# Patient Record
Sex: Female | Born: 1955 | Race: White | Hispanic: No | Marital: Married | Smoking: Never smoker
Health system: Southern US, Community
[De-identification: ages and names within clinical notes are randomized; demographics above are authoritative.]

## PROBLEM LIST (undated history)

## (undated) DIAGNOSIS — Z800 Family history of malignant neoplasm of digestive organs: Secondary | ICD-10-CM

## (undated) DIAGNOSIS — M8580 Other specified disorders of bone density and structure, unspecified site: Secondary | ICD-10-CM

## (undated) DIAGNOSIS — N6019 Diffuse cystic mastopathy of unspecified breast: Secondary | ICD-10-CM

## (undated) DIAGNOSIS — C541 Malignant neoplasm of endometrium: Secondary | ICD-10-CM

## (undated) DIAGNOSIS — Z801 Family history of malignant neoplasm of trachea, bronchus and lung: Secondary | ICD-10-CM

## (undated) HISTORY — DX: Family history of malignant neoplasm of digestive organs: Z80.0

## (undated) HISTORY — DX: Family history of malignant neoplasm of trachea, bronchus and lung: Z80.1

## (undated) HISTORY — DX: Other specified disorders of bone density and structure, unspecified site: M85.80

## (undated) HISTORY — PX: TUBAL LIGATION: SHX77

## (undated) HISTORY — DX: Malignant neoplasm of endometrium: C54.1

## (undated) HISTORY — DX: Diffuse cystic mastopathy of unspecified breast: N60.19

---

## 1990-07-20 HISTORY — PX: TOTAL ABDOMINAL HYSTERECTOMY: SHX209

## 1998-10-21 ENCOUNTER — Other Ambulatory Visit: Admission: RE | Admit: 1998-10-21 | Discharge: 1998-10-21 | Payer: Self-pay | Admitting: Obstetrics and Gynecology

## 1999-04-28 ENCOUNTER — Encounter: Admission: RE | Admit: 1999-04-28 | Discharge: 1999-04-28 | Payer: Self-pay | Admitting: Obstetrics and Gynecology

## 1999-04-28 ENCOUNTER — Other Ambulatory Visit: Admission: RE | Admit: 1999-04-28 | Discharge: 1999-04-28 | Payer: Self-pay | Admitting: Obstetrics and Gynecology

## 1999-10-27 ENCOUNTER — Other Ambulatory Visit: Admission: RE | Admit: 1999-10-27 | Discharge: 1999-10-27 | Payer: Self-pay | Admitting: Obstetrics and Gynecology

## 2000-04-07 ENCOUNTER — Encounter: Admission: RE | Admit: 2000-04-07 | Discharge: 2000-04-07 | Payer: Self-pay | Admitting: Family Medicine

## 2000-04-07 ENCOUNTER — Encounter: Payer: Self-pay | Admitting: Family Medicine

## 2000-05-03 ENCOUNTER — Other Ambulatory Visit: Admission: RE | Admit: 2000-05-03 | Discharge: 2000-05-03 | Payer: Self-pay | Admitting: Obstetrics and Gynecology

## 2000-05-03 ENCOUNTER — Encounter: Payer: Self-pay | Admitting: Obstetrics and Gynecology

## 2000-05-03 ENCOUNTER — Encounter: Admission: RE | Admit: 2000-05-03 | Discharge: 2000-05-03 | Payer: Self-pay | Admitting: Obstetrics and Gynecology

## 2000-06-23 ENCOUNTER — Ambulatory Visit (HOSPITAL_COMMUNITY): Admission: RE | Admit: 2000-06-23 | Discharge: 2000-06-23 | Payer: Self-pay | Admitting: Gastroenterology

## 2001-05-04 ENCOUNTER — Encounter: Admission: RE | Admit: 2001-05-04 | Discharge: 2001-05-04 | Payer: Self-pay | Admitting: Family Medicine

## 2001-05-04 ENCOUNTER — Encounter: Payer: Self-pay | Admitting: Family Medicine

## 2001-08-02 ENCOUNTER — Other Ambulatory Visit: Admission: RE | Admit: 2001-08-02 | Discharge: 2001-08-02 | Payer: Self-pay | Admitting: Obstetrics and Gynecology

## 2001-08-24 ENCOUNTER — Encounter: Admission: RE | Admit: 2001-08-24 | Discharge: 2001-08-24 | Payer: Self-pay | Admitting: Obstetrics and Gynecology

## 2001-08-24 ENCOUNTER — Encounter: Payer: Self-pay | Admitting: Obstetrics and Gynecology

## 2002-05-10 ENCOUNTER — Encounter: Admission: RE | Admit: 2002-05-10 | Discharge: 2002-05-10 | Payer: Self-pay | Admitting: Obstetrics and Gynecology

## 2002-05-10 ENCOUNTER — Encounter: Payer: Self-pay | Admitting: Obstetrics and Gynecology

## 2002-08-04 ENCOUNTER — Other Ambulatory Visit: Admission: RE | Admit: 2002-08-04 | Discharge: 2002-08-04 | Payer: Self-pay | Admitting: Obstetrics and Gynecology

## 2003-05-25 ENCOUNTER — Encounter: Admission: RE | Admit: 2003-05-25 | Discharge: 2003-05-25 | Payer: Self-pay | Admitting: Obstetrics and Gynecology

## 2003-08-09 ENCOUNTER — Other Ambulatory Visit: Admission: RE | Admit: 2003-08-09 | Discharge: 2003-08-09 | Payer: Self-pay | Admitting: Obstetrics and Gynecology

## 2003-10-17 ENCOUNTER — Ambulatory Visit (HOSPITAL_COMMUNITY): Admission: RE | Admit: 2003-10-17 | Discharge: 2003-10-17 | Payer: Self-pay | Admitting: *Deleted

## 2003-10-17 ENCOUNTER — Ambulatory Visit (HOSPITAL_BASED_OUTPATIENT_CLINIC_OR_DEPARTMENT_OTHER): Admission: RE | Admit: 2003-10-17 | Discharge: 2003-10-17 | Payer: Self-pay | Admitting: *Deleted

## 2003-10-17 ENCOUNTER — Encounter (INDEPENDENT_AMBULATORY_CARE_PROVIDER_SITE_OTHER): Payer: Self-pay | Admitting: Specialist

## 2004-05-28 ENCOUNTER — Encounter: Admission: RE | Admit: 2004-05-28 | Discharge: 2004-05-28 | Payer: Self-pay | Admitting: *Deleted

## 2004-08-13 ENCOUNTER — Other Ambulatory Visit: Admission: RE | Admit: 2004-08-13 | Discharge: 2004-08-13 | Payer: Self-pay | Admitting: *Deleted

## 2005-06-03 ENCOUNTER — Encounter: Admission: RE | Admit: 2005-06-03 | Discharge: 2005-06-03 | Payer: Self-pay | Admitting: Family Medicine

## 2005-10-14 ENCOUNTER — Other Ambulatory Visit: Admission: RE | Admit: 2005-10-14 | Discharge: 2005-10-14 | Payer: Self-pay | Admitting: Obstetrics & Gynecology

## 2005-11-04 ENCOUNTER — Encounter: Admission: RE | Admit: 2005-11-04 | Discharge: 2005-11-04 | Payer: Self-pay | Admitting: Obstetrics & Gynecology

## 2006-06-09 ENCOUNTER — Encounter: Admission: RE | Admit: 2006-06-09 | Discharge: 2006-06-09 | Payer: Self-pay | Admitting: Obstetrics & Gynecology

## 2006-10-20 ENCOUNTER — Other Ambulatory Visit: Admission: RE | Admit: 2006-10-20 | Discharge: 2006-10-20 | Payer: Self-pay | Admitting: Obstetrics & Gynecology

## 2007-07-27 ENCOUNTER — Encounter: Admission: RE | Admit: 2007-07-27 | Discharge: 2007-07-27 | Payer: Self-pay | Admitting: Obstetrics & Gynecology

## 2007-10-26 ENCOUNTER — Other Ambulatory Visit: Admission: RE | Admit: 2007-10-26 | Discharge: 2007-10-26 | Payer: Self-pay | Admitting: Obstetrics & Gynecology

## 2008-09-04 ENCOUNTER — Encounter: Admission: RE | Admit: 2008-09-04 | Discharge: 2008-09-04 | Payer: Self-pay | Admitting: Obstetrics & Gynecology

## 2008-11-01 ENCOUNTER — Other Ambulatory Visit: Admission: RE | Admit: 2008-11-01 | Discharge: 2008-11-01 | Payer: Self-pay | Admitting: Obstetrics & Gynecology

## 2009-11-13 ENCOUNTER — Encounter: Admission: RE | Admit: 2009-11-13 | Discharge: 2009-11-13 | Payer: Self-pay | Admitting: Obstetrics & Gynecology

## 2010-08-09 ENCOUNTER — Other Ambulatory Visit: Payer: Self-pay | Admitting: Obstetrics & Gynecology

## 2010-08-09 DIAGNOSIS — Z1239 Encounter for other screening for malignant neoplasm of breast: Secondary | ICD-10-CM

## 2010-12-05 NOTE — Op Note (Signed)
NAMESOFHIA, ULIBARRI                       ACCOUNT NO.:  [id number]   MEDICAL RECORD NO.:  [id number]                   PATIENT TYPE:  AMB   LOCATION:  DSC                                  FACILITY:  [facility]   PHYSICIAN:  Lowell Bouton, M.D.      DATE OF BIRTH:  03-24-1956   DATE OF PROCEDURE:  10/17/2003  DATE OF DISCHARGE:                                 OPERATIVE REPORT   PREOPERATIVE DIAGNOSES:  Dorsal ganglion, left wrist.   POSTOPERATIVE DIAGNOSES:  Dorsal ganglion, left wrist.   PROCEDURE:  Excision of dorsal ganglion, left wrist.   SURGEON:  Lowell Bouton, M.D.   ANESTHESIA:  General.   FINDINGS:  The patient had a very large ganglion cyst that appeared to arise  from the scapholunate joint with a very narrow stalk.   DESCRIPTION OF PROCEDURE:  Under general anesthesia with a tourniquet on the  left arm, the left hand was prepped and draped in the usual fashion and  after exsanguinating the limb, the tourniquet was inflated to 250 mmHg.  A  transverse incision was made over the dorsum of the wrist and sharp  dissection was carried through the subcutaneous tissues. Bleeding points  were coagulated.  Blunt dissection was carried down to the ganglion and it  was dissected out bluntly.  The ganglion appeared to arise from the  scapholunate interval with a small stalk. This was completely dissected out  and the ganglion was removed in toto.  The stalk was then debrided down to  the joint and the base of it was coagulated. The wound was irrigated with  saline, vessiloop drain was left in for drainage, 0.5% Marcaine was placed  in the skin for pain control.  The subcutaneous tissue was closed with 4-0  Vicryl and the skin with a 3-0 subcuticular Prolene. Steri-Strips were  applied followed by sterile dressings and a volar wrist splint. The  tourniquet was released with good circulation of the hand. The patient went  to the recovery room awake in stable  and good condition.                                               Lowell Bouton, M.D.    EMM/MEDQ  D:  10/17/2003  T:  10/17/2003  Job:  [id number]

## 2010-12-05 NOTE — Procedures (Signed)
[facility]  Patient:    Gina Maldonado, Gina Maldonado                    MRN: [id number] Proc. Date: 06/23/00 Adm. Date:  [id number] Attending:  Louie Bun CC:         Gloriajean Dell. Andrey Campanile, M.D.   Procedure Report  PROCEDURE:  Colonoscopy.  INDICATION FOR PROCEDURE:  Family history of colon cancer in a first degree relative.  DESCRIPTION OF PROCEDURE:  The patient was placed in the left lateral decubitus position and placed on the pulse monitor with continuous low-flow oxygen delivered by nasal cannula.  She was sedated with 70 mg IV Demerol and 9 mg IV Versed.  The Olympus video colonoscope was inserted into the rectum and advanced to the cecum, confirmed by transillumination of McBurneys point and visualization of the ileocecal valve and appendiceal orifice.  The prep was good.  The cecum, ascending, transverse, descending, and sigmoid colon all appeared normal with no masses, polyps, diverticula, or other mucosal abnormalities.  The rectum likewise appeared normal.  On retroflex view, the anus did reveal some small internal hemorrhoids.  The colonoscope was then withdrawn and the patient returned to the recovery room in stable condition. She tolerated the procedure well, and there were no immediate complications.  IMPRESSION:  Internal hemorrhoids, otherwise normal colonoscopy.  PLAN:  Repeat colonoscopy in five years based on the family history. DD:  06/23/00 TD:  06/23/00 Job: [id number]

## 2011-01-13 ENCOUNTER — Ambulatory Visit: Payer: Self-pay

## 2011-01-13 ENCOUNTER — Ambulatory Visit
Admission: RE | Admit: 2011-01-13 | Discharge: 2011-01-13 | Disposition: A | Discharge status: Home / Self Care | Payer: Commercial Managed Care - PPO | Source: Ambulatory Visit | Attending: Obstetrics & Gynecology | Admitting: Obstetrics & Gynecology

## 2011-01-13 DIAGNOSIS — Z1239 Encounter for other screening for malignant neoplasm of breast: Secondary | ICD-10-CM

## 2011-08-05 ENCOUNTER — Other Ambulatory Visit: Payer: Self-pay | Admitting: Obstetrics & Gynecology

## 2011-08-05 DIAGNOSIS — Z1231 Encounter for screening mammogram for malignant neoplasm of breast: Secondary | ICD-10-CM

## 2012-03-08 ENCOUNTER — Ambulatory Visit
Admission: RE | Admit: 2012-03-08 | Discharge: 2012-03-08 | Disposition: A | Discharge status: Home / Self Care | Payer: Commercial Managed Care - PPO | Source: Ambulatory Visit | Attending: Obstetrics & Gynecology | Admitting: Obstetrics & Gynecology

## 2012-03-08 DIAGNOSIS — Z1231 Encounter for screening mammogram for malignant neoplasm of breast: Secondary | ICD-10-CM

## 2013-02-09 ENCOUNTER — Other Ambulatory Visit: Payer: Self-pay

## 2013-02-09 DIAGNOSIS — Z1231 Encounter for screening mammogram for malignant neoplasm of breast: Secondary | ICD-10-CM

## 2013-03-27 ENCOUNTER — Ambulatory Visit
Admission: RE | Admit: 2013-03-27 | Discharge: 2013-03-27 | Disposition: A | Discharge status: Home / Self Care | Payer: Commercial Managed Care - PPO | Source: Ambulatory Visit

## 2013-03-27 DIAGNOSIS — Z1231 Encounter for screening mammogram for malignant neoplasm of breast: Secondary | ICD-10-CM

## 2013-04-03 ENCOUNTER — Encounter: Payer: Self-pay | Admitting: Obstetrics & Gynecology

## 2013-05-24 ENCOUNTER — Ambulatory Visit: Payer: Self-pay | Admitting: Obstetrics & Gynecology

## 2013-05-25 ENCOUNTER — Encounter: Payer: Self-pay | Admitting: Obstetrics & Gynecology

## 2013-05-26 ENCOUNTER — Ambulatory Visit (INDEPENDENT_AMBULATORY_CARE_PROVIDER_SITE_OTHER): Payer: Commercial Managed Care - PPO | Admitting: Obstetrics & Gynecology

## 2013-05-26 ENCOUNTER — Ambulatory Visit: Payer: Self-pay | Admitting: Obstetrics & Gynecology

## 2013-05-26 ENCOUNTER — Encounter: Payer: Self-pay | Admitting: Obstetrics & Gynecology

## 2013-05-26 VITALS — BP 124/82 | HR 60 | Resp 16 | Ht 65.25 in | Wt 179.2 lb

## 2013-05-26 DIAGNOSIS — Z01419 Encounter for gynecological examination (general) (routine) without abnormal findings: Secondary | ICD-10-CM

## 2013-05-26 DIAGNOSIS — Z0000 Encounter for general adult medical examination without abnormal findings: Secondary | ICD-10-CM

## 2013-05-26 DIAGNOSIS — Z124 Encounter for screening for malignant neoplasm of cervix: Secondary | ICD-10-CM

## 2013-05-26 LAB — COMPREHENSIVE METABOLIC PANEL
ALT: 16 U/L (ref 0–35)
AST: 15 U/L (ref 0–37)
Albumin: 4.2 g/dL (ref 3.5–5.2)
Alkaline Phosphatase: 52 U/L (ref 39–117)
BUN: 18 mg/dL (ref 6–23)
CO2: 29 mEq/L (ref 19–32)
Calcium: 9.5 mg/dL (ref 8.4–10.5)
Chloride: 102 mEq/L (ref 96–112)
Creat: 0.98 mg/dL (ref 0.50–1.10)
Glucose, Bld: 80 mg/dL (ref 70–99)
Potassium: 4.1 mEq/L (ref 3.5–5.3)
Sodium: 140 mEq/L (ref 135–145)
Total Bilirubin: 0.5 mg/dL (ref 0.3–1.2)
Total Protein: 6.7 g/dL (ref 6.0–8.3)

## 2013-05-26 LAB — LIPID PANEL
Cholesterol: 178 mg/dL (ref 0–200)
HDL: 32 mg/dL — ABNORMAL LOW (ref >39)
LDL Cholesterol: 122 mg/dL — ABNORMAL HIGH (ref 0–99)
Total CHOL/HDL Ratio: 5.6 Ratio
Triglycerides: 118 mg/dL (ref <150)
VLDL: 24 mg/dL (ref 0–40)

## 2013-05-26 LAB — POCT URINALYSIS DIPSTICK
Bilirubin, UA: NEGATIVE
Blood, UA: NEGATIVE
Glucose, UA: NEGATIVE
Ketones, UA: NEGATIVE
Leukocytes, UA: NEGATIVE
Nitrite, UA: NEGATIVE
Protein, UA: NEGATIVE
Urobilinogen, UA: NEGATIVE
pH, UA: 5

## 2013-05-26 LAB — TSH: TSH: 1.754 u[IU]/mL (ref 0.350–4.500)

## 2013-05-26 LAB — HEMOGLOBIN, FINGERSTICK: Hemoglobin, fingerstick: 13.7 g/dL (ref 12.0–16.0)

## 2013-05-26 LAB — VITAMIN D 25 HYDROXY (VIT D DEFICIENCY, FRACTURES): Vit D, 25-Hydroxy: 27 ng/mL — ABNORMAL LOW (ref 30–89)

## 2013-05-26 MED ORDER — ESTRADIOL 0.05 MG/24HR TD PTTW: 1.0000 | MEDICATED_PATCH | TRANSDERMAL | Status: DC

## 2013-05-26 NOTE — Progress Notes (Signed)
57 y.o. G2P2 MarriedCaucasianF here for annual exam.  No vaginal bleeding.  Has had flu shot.  Discussed genetic testing last year.  She declines for now.  Doing really well with anxiety.  Hasn't taken a Xanax in over a year and a half.  Patient's last menstrual period was 07/20/1994.          Sexually active: yes  The current method of family planning is status post hysterectomy.    Exercising: yes  walking Smoker:  no  Health Maintenance: Pap:  03/08/12 WNL History of abnormal Pap:  Yes h/o endometrial cancer MMG:  03/27/13 3D normal Colonoscopy:  2011 repeat in 5 years, Dr. Madilyn Fireman BMD:   2007 normal, plan in 2015 TDaP:  1/09 Screening Labs: today, Hb today: 13.7, Urine today: negative   reports that she has never smoked. She has never used smokeless tobacco. She reports that she does not drink alcohol or use illicit drugs.  Past Medical History  Diagnosis Date  . Osteopenia   . Fibrocystic breast changes   . Endometrial cancer     adenocarcinoma    Past Surgical History  Procedure Laterality Date  . Tubal ligation    . Total abdominal hysterectomy      BSO    Current Outpatient Prescriptions  Medication Sig Dispense Refill  . CALCIUM PO Take by mouth daily.      Marland Kitchen estradiol (MINIVELLE) 0.05 MG/24HR patch Place 1 patch onto the skin 2 (two) times a week.      Marland Kitchen OMEPRAZOLE PO Take by mouth daily.      Marland Kitchen ALPRAZolam (XANAX) 0.5 MG tablet Take 0.5 mg by mouth at bedtime as needed for anxiety.      Marland Kitchen Fexofenadine-Pseudoephedrine (ALLEGRA-D PO) Take by mouth as needed.      . ValACYclovir HCl (VALTREX PO) Take by mouth as needed.       No current facility-administered medications for this visit.    Family History  Problem Relation Age of Onset  . Diabetes Maternal Grandfather   . Colon cancer Father   . Colon cancer Other     paternal great uncle  . Hypertension Mother     ROS:  Pertinent items are noted in HPI.  Otherwise, a comprehensive ROS was negative.  Exam:    BP 124/82  Pulse 60  Resp 16  Ht 5' 5.25" (1.657 m)  Wt 179 lb 3.2 oz (81.285 kg)  BMI 29.61 kg/m2  LMP 07/20/1994  Weight change: +3    Height: 5' 5.25" (165.7 cm)  Ht Readings from Last 3 Encounters:  05/26/13 5' 5.25" (1.657 m)    General appearance: alert, cooperative and appears stated age Head: Normocephalic, without obvious abnormality, atraumatic Neck: no adenopathy, supple, symmetrical, trachea midline and thyroid normal to inspection and palpation Lungs: clear to auscultation bilaterally Breasts: normal appearance, no masses or tenderness Heart: regular rate and rhythm Abdomen: soft, non-tender; bowel sounds normal; no masses,  no organomegaly Extremities: extremities normal, atraumatic, no cyanosis or edema Skin: Skin color, texture, turgor normal. No rashes or lesions Lymph nodes: Cervical, supraclavicular, and axillary nodes normal. No abnormal inguinal nodes palpated Neurologic: Grossly normal   Pelvic: External genitalia:  no lesions              Urethra:  normal appearing urethra with no masses, tenderness or lesions              Bartholins and Skenes: normal  Vagina: normal appearing vagina with normal color and discharge, no lesions              Cervix: absent              Pap taken: yes Bimanual Exam:  Uterus:  uterus absent              Adnexa: no mass, fullness, tenderness               Rectovaginal: Confirms               Anus:  normal sphincter tone, no lesions  A:  Well Woman with normal exam H/O adenocarcinoma (Stage IIB), s/p TAH/BSO 1996 H/O colon, breast, stomach cancer Fibrocystic Breast Disease Vit D was low last year.  Didn't take medication.  P:   Mammogram yearly CMP, TSH, Vit D, Lipids pap smear today Minivelle 0.05mg  patches twice weekly.  #8/13. return annually or prn  An After Visit Summary was printed and given to the patient.

## 2013-05-26 NOTE — Patient Instructions (Signed)

## 2013-05-29 LAB — IPS PAP SMEAR ONLY

## 2013-06-09 ENCOUNTER — Telehealth: Payer: Self-pay

## 2013-06-09 NOTE — Telephone Encounter (Signed)
Lmtcb//kn 

## 2013-06-09 NOTE — Telephone Encounter (Signed)
Message copied by Elisha Headland on Fri Jun 09, 2013 10:05 AM ------      Message from: Jerene Bears      Created: Fri Jun 02, 2013  8:51 AM       Inform cholesterol is stable.  HDLs are low (the goods) so will recheck one year.  CMP nl. Vit D still low.  Gave rx last year.  She didn't take.  Would she this yr or would she take OTC.  If OTC, 2000 IU daily. Pap fine. ------

## 2013-06-13 NOTE — Telephone Encounter (Signed)
Patient notified of all results. 

## 2013-06-22 ENCOUNTER — Telehealth: Payer: Self-pay | Admitting: Obstetrics & Gynecology

## 2013-06-22 NOTE — Telephone Encounter (Signed)
Pt is calling about a referral that was suppose to be done already. She also wants to get her tsh results.

## 2013-06-22 NOTE — Telephone Encounter (Signed)
Message left to return call to Gail at [phone].   Lab Results  Component Value Date   TSH 1.754 05/26/2013      Dr. Hyacinth Meeker, it looks like this patient wants to go ahead with BRCA testing prior to the end of the year. She declined at her last office visit. Is it okay to schedule or do you need to meet with her first?  Notes Recorded by Lorrene Reid, CMA on 06/13/2013 at 10:37 AM Patient notified of all results. Will start OTC vitamin D 2000iu daily. Would also like to consider BRCA testing before the end of the year. States her insurance will cover as long as we fill out an authorization form. Insurance will be changing after the first of the year, so would like to start this process. Please advise.

## 2013-06-22 NOTE — Telephone Encounter (Signed)
Tresa Endo gave results.  TSH normal.    I am sending all of my patient's to genetic counselors now due to several additional genetic tests being done for breast/ovarian cancer--not just BRCA 1/2.  This is different than what we discussed at office visit.  Number is [phone], ext [phone].  Can you schedule?

## 2013-06-23 NOTE — Telephone Encounter (Signed)
Message left on voicemail for [phone] ext [phone] to return my call to assist patient with scheduling an appointment.

## 2013-06-23 NOTE — Telephone Encounter (Signed)
Spoke with patient. Advised of message from Dr. Hyacinth Meeker. Advised I left message for Genetic Counseling this morning and was waiting to hear back.   Advised I would call back when I heard from them.   Patient requests I call mobile at [phone].

## 2013-06-27 NOTE — Telephone Encounter (Signed)
Referral information faxed to genetics at this time.

## 2013-06-27 NOTE — Telephone Encounter (Signed)
Spoke with York Cerise at [facility], she is scheduler at [phone] and she has received fax. Will call patient to schedule.

## 2013-07-04 ENCOUNTER — Other Ambulatory Visit: Payer: Commercial Managed Care - PPO

## 2013-07-04 ENCOUNTER — Ambulatory Visit: Payer: PRIVATE HEALTH INSURANCE | Admitting: Genetic Counselor

## 2013-07-04 NOTE — Telephone Encounter (Signed)
Spoke with patient and she was contacted for a new appointment, Gina Maldonado is to call back.

## 2013-07-04 NOTE — Telephone Encounter (Signed)
Patient calling states that she has not received information for appointment tomorrow. I called over to scheduler Tiffanie, Patients appointment is for today according to [employer]. Advised Tiffanie that I do not think patient is aware of that. Tifannie states she will call her at this time on cell phone and home number.

## 2013-07-04 NOTE — Telephone Encounter (Signed)
Patient has questions about the genetic counseling tomorrow.

## 2013-07-04 NOTE — Progress Notes (Signed)
1st no show.  Please r/s the patient if she wants to be seen.

## 2013-07-05 ENCOUNTER — Other Ambulatory Visit: Payer: Commercial Managed Care - PPO

## 2013-07-05 ENCOUNTER — Encounter: Payer: Commercial Managed Care - PPO | Admitting: Genetic Counselor

## 2013-08-24 ENCOUNTER — Telehealth: Payer: Self-pay | Admitting: Obstetrics & Gynecology

## 2013-08-24 NOTE — Telephone Encounter (Signed)
Pt wants to talk with the nurse she has some questions concerning her annual.

## 2013-08-24 NOTE — Telephone Encounter (Signed)
LMTCB

## 2013-08-24 NOTE — Telephone Encounter (Signed)
Patient calling. States for wellness plan at work she needs a form signed and completed by Dr. Sabra Heck. Advised can fax in the form, be sure to let us know how she wants it returned. States she will fill in what she can but it must have a physicians signature.   Patient will fax form tomorrow.

## 2013-09-01 NOTE — Telephone Encounter (Signed)
I have not seen this, FYI.

## 2013-09-01 NOTE — Telephone Encounter (Signed)
Message left to return my call. Advised can fax forms to my attention as well.

## 2013-09-08 NOTE — Telephone Encounter (Signed)
Patient states that fax did not go through.  States she will mail forms since she doesn't need them until October. Routing to provider for final review. Patient agreeable to disposition. Will close encounter

## 2013-09-25 ENCOUNTER — Telehealth: Payer: Self-pay

## 2013-09-25 NOTE — Telephone Encounter (Signed)
Lmtcb//kn 

## 2013-10-19 ENCOUNTER — Ambulatory Visit: Payer: Commercial Managed Care - PPO

## 2013-10-19 ENCOUNTER — Ambulatory Visit: Payer: PRIVATE HEALTH INSURANCE

## 2013-11-02 ENCOUNTER — Telehealth: Payer: Self-pay | Admitting: Obstetrics & Gynecology

## 2013-11-02 NOTE — Telephone Encounter (Signed)
Patient came in 10/19/13 for a nurse appointment . Patient left a short form to be completed and told it would be mailed to her. Patient still has no received the forms by mail. I told patient I would check on this and call her back.

## 2013-11-03 NOTE — Telephone Encounter (Signed)
Done.  OK to mail.

## 2013-11-03 NOTE — Telephone Encounter (Signed)
Patient calling about forms that were to be mailed to her.

## 2013-11-03 NOTE — Telephone Encounter (Signed)
Dr Sabra Heck, will you let me know when these are ready for me to mail out.

## 2014-01-08 NOTE — Telephone Encounter (Signed)
Forms completed. Patient aware they will be coming in the mail.//kn

## 2014-03-02 ENCOUNTER — Other Ambulatory Visit: Payer: Self-pay

## 2014-03-02 DIAGNOSIS — Z1231 Encounter for screening mammogram for malignant neoplasm of breast: Secondary | ICD-10-CM

## 2014-04-02 ENCOUNTER — Ambulatory Visit
Admission: RE | Admit: 2014-04-02 | Discharge: 2014-04-02 | Disposition: A | Discharge status: Home / Self Care | Payer: PRIVATE HEALTH INSURANCE | Source: Ambulatory Visit

## 2014-04-02 DIAGNOSIS — Z1231 Encounter for screening mammogram for malignant neoplasm of breast: Secondary | ICD-10-CM

## 2014-05-21 ENCOUNTER — Encounter: Payer: Self-pay | Admitting: Obstetrics & Gynecology

## 2014-06-25 ENCOUNTER — Encounter: Payer: Self-pay | Admitting: Obstetrics & Gynecology

## 2014-06-25 ENCOUNTER — Ambulatory Visit (INDEPENDENT_AMBULATORY_CARE_PROVIDER_SITE_OTHER): Payer: PRIVATE HEALTH INSURANCE | Admitting: Obstetrics & Gynecology

## 2014-06-25 VITALS — BP 124/72 | HR 68 | Resp 16 | Ht 65.0 in | Wt 181.8 lb

## 2014-06-25 DIAGNOSIS — Z0000 Encounter for general adult medical examination without abnormal findings: Secondary | ICD-10-CM

## 2014-06-25 DIAGNOSIS — Z124 Encounter for screening for malignant neoplasm of cervix: Secondary | ICD-10-CM

## 2014-06-25 DIAGNOSIS — Z01419 Encounter for gynecological examination (general) (routine) without abnormal findings: Secondary | ICD-10-CM

## 2014-06-25 LAB — POCT URINALYSIS DIPSTICK
Bilirubin, UA: NEGATIVE
Glucose, UA: NEGATIVE
Ketones, UA: NEGATIVE
Leukocytes, UA: NEGATIVE
Nitrite, UA: NEGATIVE
PROTEIN UA: NEGATIVE
RBC UA: NEGATIVE
Urobilinogen, UA: NEGATIVE
pH, UA: 5

## 2014-06-25 LAB — LIPID PANEL
Cholesterol: 169 mg/dL (ref 0–200)
HDL: 41 mg/dL (ref >39)
LDL Cholesterol: 113 mg/dL — ABNORMAL HIGH (ref 0–99)
Total CHOL/HDL Ratio: 4.1 Ratio
Triglycerides: 73 mg/dL (ref <150)
VLDL: 15 mg/dL (ref 0–40)

## 2014-06-25 LAB — COMPREHENSIVE METABOLIC PANEL
ALBUMIN: 4.4 g/dL (ref 3.5–5.2)
ALT: 13 U/L (ref 0–35)
AST: 16 U/L (ref 0–37)
Alkaline Phosphatase: 75 U/L (ref 39–117)
BUN: 15 mg/dL (ref 6–23)
CO2: 25 meq/L (ref 19–32)
CREATININE: 0.79 mg/dL (ref 0.50–1.10)
Calcium: 9.5 mg/dL (ref 8.4–10.5)
Chloride: 104 mEq/L (ref 96–112)
Glucose, Bld: 90 mg/dL (ref 70–99)
POTASSIUM: 4.1 meq/L (ref 3.5–5.3)
Sodium: 139 mEq/L (ref 135–145)
Total Bilirubin: 0.5 mg/dL (ref 0.2–1.2)
Total Protein: 6.8 g/dL (ref 6.0–8.3)

## 2014-06-25 LAB — HEMOGLOBIN, FINGERSTICK: Hemoglobin, fingerstick: 13.8 g/dL (ref 12.0–16.0)

## 2014-06-25 LAB — TSH: TSH: 1.314 u[IU]/mL (ref 0.350–4.500)

## 2014-06-25 MED ORDER — ESTRADIOL 0.05 MG/24HR TD PTTW: 1.0000 | MEDICATED_PATCH | TRANSDERMAL | Status: DC

## 2014-06-25 MED ORDER — VALACYCLOVIR HCL 500 MG PO TABS: 500.0000 mg | ORAL_TABLET | Freq: Every day | ORAL | Status: DC

## 2014-06-25 MED ORDER — ALPRAZOLAM 0.5 MG PO TABS: 0.5000 mg | ORAL_TABLET | Freq: Every evening | ORAL | Status: DC | PRN

## 2014-06-25 MED ORDER — NAPROXEN 500 MG PO TABS: 500.0000 mg | ORAL_TABLET | Freq: Two times a day (BID) | ORAL | Status: DC

## 2014-06-25 NOTE — Addendum Note (Signed)
Addended by: Megan Salon on: 06/25/2014 02:19 PM   Modules accepted: Miquel Dunn

## 2014-06-25 NOTE — Progress Notes (Addendum)
58 y.o. G2P2 MarriedCaucasianF here for annual exam.  No vaginal bleeding.  Doing well.  Ate cookie today.  Needs labs but isn't fasting.  This has to be done for work.     Patient's last menstrual period was 07/20/1994.          Sexually active: Yes.    The current method of family planning is status post hysterectomy.    Exercising: Yes.    walking Smoker:  no  Health Maintenance: Pap:  05/26/13 WNL History of abnormal Pap:  Yes h/o endometrial cancer  MMG:  04/02/14 3D-normal Colonoscopy:  2011-repeat in 5 years Dr Amedeo Plenty BMD:   2007 TDaP:  1/09 Screening Labs: done today, Hb today: 13.8, Urine today: negative   reports that she has never smoked. She has never used smokeless tobacco. She reports that she does not drink alcohol or use illicit drugs.  Past Medical History  Diagnosis Date  . Osteopenia   . Fibrocystic breast changes   . Endometrial cancer     adenocarcinoma    Past Surgical History  Procedure Laterality Date  . Tubal ligation    . Total abdominal hysterectomy      BSO    Current Outpatient Prescriptions  Medication Sig Dispense Refill  . ALPRAZolam (XANAX) 0.5 MG tablet Take 0.5 mg by mouth at bedtime as needed for anxiety.    Marland Kitchen CALCIUM PO Take by mouth daily.    Marland Kitchen estradiol (MINIVELLE) 0.05 MG/24HR patch Place 1 patch (0.05 mg total) onto the skin 2 (two) times a week. 24 patch 4  . Fexofenadine-Pseudoephedrine (ALLEGRA-D PO) Take by mouth as needed.    Marland Kitchen OMEPRAZOLE PO Take by mouth daily.    . ValACYclovir HCl (VALTREX PO) Take by mouth as needed.     No current facility-administered medications for this visit.    Family History  Problem Relation Age of Onset  . Diabetes Maternal Grandfather   . Colon cancer Father   . Colon cancer Other     paternal great uncle  . Hypertension Mother     ROS:  Pertinent items are noted in HPI.  Otherwise, a comprehensive ROS was negative.  Exam:   BP 124/72 mmHg  Pulse 68  Resp 16  Ht 5\' 5"  (1.651 m)  Wt  181 lb 12.8 oz (82.464 kg)  BMI 30.25 kg/m2  LMP 07/20/1994   Height: 5\' 5"  (165.1 cm)  Ht Readings from Last 3 Encounters:  06/25/14 5\' 5"  (1.651 m)  05/26/13 5' 5.25" (1.657 m)    General appearance: alert, cooperative and appears stated age Head: Normocephalic, without obvious abnormality, atraumatic Neck: no adenopathy, supple, symmetrical, trachea midline and thyroid normal to inspection and palpation Lungs: clear to auscultation bilaterally Breasts: normal appearance, no masses or tenderness Heart: regular rate and rhythm Abdomen: soft, non-tender; bowel sounds normal; no masses,  no organomegaly Extremities: extremities normal, atraumatic, no cyanosis or edema Skin: Skin color, texture, turgor normal. No rashes or lesions Lymph nodes: Cervical, supraclavicular, and axillary nodes normal. No abnormal inguinal nodes palpated Neurologic: Grossly normal   Pelvic: External genitalia:  no lesions              Urethra:  normal appearing urethra with no masses, tenderness or lesions              Bartholins and Skenes: normal                 Vagina: normal appearing vagina with normal color and discharge,  no lesions              Cervix: absent              Pap taken: Yes.   Bimanual Exam:  Uterus:  uterus absent              Adnexa: no mass, fullness, tenderness               Rectovaginal: Confirms               Anus:  normal sphincter tone, no lesions  A:  Well Woman with normal exam H/O adenocarcinoma (Stage IIB), s/p TAH/BSO 1996 H/O colon, breast, stomach cancer Fibrocystic Breast Disease Vit D was low last year. Didn't take medication.  Declines medication.    P: Mammogram yearly CMP, TSH, Lipids today pap smear today Minivelle 0.05mg  patches twice weekly. #8/13. Valtrex 500mg  daily.  #30/13 RF Pt requests rx of Naprosyn 500mg  prn HA #30/0RF Xanax 0.5mg  po qd prn anxiety (going to fly in a few weeks and uses for this) #30/0RF BMD next year due to insurance  coverage. Colonoscopy due next year. return annually or prn  An After Visit Summary was printed and given to the patient.

## 2014-06-26 LAB — IPS PAP SMEAR ONLY

## 2014-06-29 ENCOUNTER — Telehealth: Payer: Self-pay

## 2014-06-29 NOTE — Telephone Encounter (Signed)
Patient notified of all results. Has AEX scheduled for 2/17//kn

## 2014-06-29 NOTE — Telephone Encounter (Signed)
Lmtcb//kn 

## 2014-06-29 NOTE — Telephone Encounter (Signed)
-----   Message from Lyman Speller, MD sent at 06/28/2014  6:41 AM EST ----- Inform pap normal.  H/O endometrial cancer but many years out.  02 recall fine.  CMP and TSH nl.  Lipids with mildly elevated LDLs but better than last year.  HDLS better too.  Repeat yearly.

## 2014-06-29 NOTE — Telephone Encounter (Signed)
Patient is returning a call to Kelly °

## 2014-09-07 ENCOUNTER — Other Ambulatory Visit: Payer: Self-pay | Admitting: Obstetrics & Gynecology

## 2014-09-07 NOTE — Telephone Encounter (Signed)
Patient is requesting a refill for Valtrex to be called into [facility] @ [address].

## 2014-09-07 NOTE — Telephone Encounter (Signed)
Medication refill request: Valtrex 500 mg Last AEX:  06/25/14 Next AEX: 08/30/15 Last MMG (if hormonal medication request): 04/03/14 BIRADS1:Neg Refill authorized: 06/25/14 #30tab/13R. Statistician.   Called pharmacy. They have refills on file. Called patient to inform.

## 2014-12-04 ENCOUNTER — Other Ambulatory Visit: Payer: Self-pay | Admitting: Obstetrics & Gynecology

## 2014-12-05 NOTE — Telephone Encounter (Signed)
Medication refill request: Xanax  Last AEX:  06/25/14 SM Next AEX: 08/30/15 SM Last MMG (if hormonal medication request): 04/03/14 BIRADS1:Neg Refill authorized: 06/25/14 #30 w/ 0R. Today please advise.

## 2015-01-14 ENCOUNTER — Telehealth: Payer: Self-pay | Admitting: Obstetrics & Gynecology

## 2015-01-14 NOTE — Telephone Encounter (Signed)
Spoke with patient. Patient calling in regards to questions about obtaining an appointment with [facility] for her mother. Staff message sent to provider.

## 2015-01-14 NOTE — Telephone Encounter (Signed)
Patient calling stating she has a question for the nurse. She declined to give any more information until speaking with the nurse.

## 2015-01-15 NOTE — Telephone Encounter (Signed)
I think we got the referral for her mother and Gay Filler is handling this.  OK to close encounter.

## 2015-03-07 ENCOUNTER — Other Ambulatory Visit: Payer: Self-pay

## 2015-03-07 DIAGNOSIS — Z1231 Encounter for screening mammogram for malignant neoplasm of breast: Secondary | ICD-10-CM

## 2015-04-15 ENCOUNTER — Ambulatory Visit
Admission: RE | Admit: 2015-04-15 | Discharge: 2015-04-15 | Disposition: A | Discharge status: Home / Self Care | Payer: PRIVATE HEALTH INSURANCE | Source: Ambulatory Visit

## 2015-04-15 DIAGNOSIS — Z1231 Encounter for screening mammogram for malignant neoplasm of breast: Secondary | ICD-10-CM

## 2015-05-05 ENCOUNTER — Other Ambulatory Visit: Payer: Self-pay | Admitting: Obstetrics & Gynecology

## 2015-05-06 NOTE — Telephone Encounter (Signed)
Medication refill request: Xanax Last AEX:  06-25-14  Next AEX: 08-30-15 Last MMG (if hormonal medication request):04-15-15 WNL Refill authorized: please advise

## 2015-05-08 NOTE — Telephone Encounter (Signed)
[facility] pharmacy calling to check on status of refill request.

## 2015-05-09 ENCOUNTER — Other Ambulatory Visit: Payer: Self-pay | Admitting: Obstetrics & Gynecology

## 2015-05-10 NOTE — Telephone Encounter (Signed)
Rx faxed to [facility], [address].//kn

## 2015-06-09 ENCOUNTER — Other Ambulatory Visit: Payer: Self-pay | Admitting: Obstetrics & Gynecology

## 2015-06-10 NOTE — Telephone Encounter (Signed)
Medication refill request: Xanax Last AEX:  06-25-14 Next AEX: 08-30-15 Last MMG (if hormonal medication request): 04-16-15 WNL Refill authorized: please advise

## 2015-06-11 ENCOUNTER — Other Ambulatory Visit: Payer: Self-pay | Admitting: Obstetrics & Gynecology

## 2015-06-11 NOTE — Telephone Encounter (Signed)
Medication refill request: Xanax Last AEX:  06-25-14 Next AEX: 08-30-15 Last MMG (if hormonal medication request): 04-16-15 WNL Refill authorized: please advise

## 2015-06-12 ENCOUNTER — Telehealth: Payer: Self-pay

## 2015-06-12 MED ORDER — ESTRADIOL 0.05 MG/24HR TD PTTW: 1.0000 | MEDICATED_PATCH | TRANSDERMAL | Status: DC

## 2015-06-12 NOTE — Telephone Encounter (Signed)
Called and lm to call back about refill about Minivelle being on back order.  Dr. Sabra Heck suggested either going to [facility] or the [facility].  Need to know which she would prefer.  Patient called back during this documentation.  She is fine with either Rx.

## 2015-06-12 NOTE — Telephone Encounter (Signed)
RF done for Vivelle dot 0.05mg  patches twice weekly to skin.  Generic substitution is ok.  Encounter closed.

## 2015-06-12 NOTE — Telephone Encounter (Signed)
Re-routing to Dr Miller.  

## 2015-08-07 ENCOUNTER — Other Ambulatory Visit: Payer: Self-pay | Admitting: Obstetrics & Gynecology

## 2015-08-07 NOTE — Telephone Encounter (Signed)
Medication refill request: Xanax Last AEX:  06-25-14 Next AEX: 08-30-15 Last MMG (if hormonal medication request): 04-16-15 WNL Refill authorized: please advise

## 2015-08-07 NOTE — Telephone Encounter (Signed)
Ubaldo Glassing from [facility] requesting refill of patient's xanax prescription.  [facility] - [address], [place] [address] Phone: [phone] Fax: [phone]

## 2015-08-08 MED ORDER — ALPRAZOLAM 0.5 MG PO TABS: ORAL_TABLET | ORAL | Status: DC

## 2015-08-08 NOTE — Telephone Encounter (Signed)
Rx faxed today to [facility]

## 2015-08-27 ENCOUNTER — Ambulatory Visit: Payer: PRIVATE HEALTH INSURANCE | Admitting: Obstetrics & Gynecology

## 2015-08-30 ENCOUNTER — Encounter: Payer: Self-pay | Admitting: Obstetrics & Gynecology

## 2015-08-30 ENCOUNTER — Ambulatory Visit (INDEPENDENT_AMBULATORY_CARE_PROVIDER_SITE_OTHER): Payer: PRIVATE HEALTH INSURANCE | Admitting: Obstetrics & Gynecology

## 2015-08-30 VITALS — BP 118/66 | HR 66 | Resp 14 | Ht 65.0 in | Wt 174.0 lb

## 2015-08-30 DIAGNOSIS — Z124 Encounter for screening for malignant neoplasm of cervix: Secondary | ICD-10-CM

## 2015-08-30 DIAGNOSIS — Z01419 Encounter for gynecological examination (general) (routine) without abnormal findings: Secondary | ICD-10-CM | POA: Diagnosis not present

## 2015-08-30 DIAGNOSIS — Z0000 Encounter for general adult medical examination without abnormal findings: Secondary | ICD-10-CM

## 2015-08-30 DIAGNOSIS — E2839 Other primary ovarian failure: Secondary | ICD-10-CM

## 2015-08-30 LAB — COMPREHENSIVE METABOLIC PANEL
ALBUMIN: 4.3 g/dL (ref 3.6–5.1)
ALT: 12 U/L (ref 6–29)
AST: 14 U/L (ref 10–35)
Alkaline Phosphatase: 54 U/L (ref 33–130)
BUN: 17 mg/dL (ref 7–25)
CALCIUM: 9.1 mg/dL (ref 8.6–10.4)
CHLORIDE: 105 mmol/L (ref 98–110)
CO2: 27 mmol/L (ref 20–31)
Creat: 0.82 mg/dL (ref 0.50–0.99)
Glucose, Bld: 81 mg/dL (ref 65–99)
Potassium: 3.7 mmol/L (ref 3.5–5.3)
Sodium: 142 mmol/L (ref 135–146)
Total Bilirubin: 0.6 mg/dL (ref 0.2–1.2)
Total Protein: 6.6 g/dL (ref 6.1–8.1)

## 2015-08-30 LAB — CBC
HCT: 42 % (ref 36.0–46.0)
Hemoglobin: 13.9 g/dL (ref 12.0–15.0)
MCH: 31.2 pg (ref 26.0–34.0)
MCHC: 33.1 g/dL (ref 30.0–36.0)
MCV: 94.4 fL (ref 78.0–100.0)
MPV: 10.7 fL (ref 8.6–12.4)
Platelets: 194 10*3/uL (ref 150–400)
RBC: 4.45 MIL/uL (ref 3.87–5.11)
RDW: 12.8 % (ref 11.5–15.5)
WBC: 5.3 10*3/uL (ref 4.0–10.5)

## 2015-08-30 LAB — POCT URINALYSIS DIPSTICK
Bilirubin, UA: NEGATIVE
Blood, UA: NEGATIVE
Glucose, UA: NEGATIVE
Ketones, UA: NEGATIVE
Leukocytes, UA: NEGATIVE
Nitrite, UA: NEGATIVE
Protein, UA: NEGATIVE
Urobilinogen, UA: NEGATIVE
pH, UA: 5

## 2015-08-30 LAB — LIPID PANEL
Cholesterol: 165 mg/dL (ref 125–200)
HDL: 30 mg/dL — ABNORMAL LOW (ref >=46)
LDL Cholesterol: 119 mg/dL (ref <130)
Total CHOL/HDL Ratio: 5.5 Ratio — ABNORMAL HIGH (ref <=5.0)
Triglycerides: 79 mg/dL (ref <150)
VLDL: 16 mg/dL (ref <30)

## 2015-08-30 LAB — TSH: TSH: 1.76 mIU/L

## 2015-08-30 LAB — HEMOGLOBIN, FINGERSTICK: Hemoglobin, fingerstick: 13.4 g/dL (ref 12.0–16.0)

## 2015-08-30 MED ORDER — VALACYCLOVIR HCL 500 MG PO TABS
500.0000 mg | ORAL_TABLET | Freq: Every day | ORAL | Status: DC
Start: 2015-08-30 — End: 2016-12-11

## 2015-08-30 MED ORDER — ZOSTER VACCINE LIVE 19400 UNT/0.65ML ~~LOC~~ SOLR: 0.6500 mL | Freq: Once | SUBCUTANEOUS | Status: DC

## 2015-08-30 MED ORDER — ESTRADIOL 0.075 MG/24HR TD PTTW: 1.0000 | MEDICATED_PATCH | TRANSDERMAL | Status: DC

## 2015-08-30 NOTE — Patient Instructions (Signed)
Schedule your bone density with your next mammogram. 

## 2015-08-30 NOTE — Progress Notes (Signed)
60 y.o. G2P2 MarriedCaucasianF here for annual exam.  Doing well.  Had a wonderful 59th birthday.  Denies vaginal bleeding.  Pt is using generic HRT and she doesn't think this helps her as much.  Pt would like to change the dosage.  The generic is much cheaper for her.    Patient's last menstrual period was 07/20/1994.          Sexually active: Yes.    The current method of family planning is status post hysterectomy.    Exercising: Yes.    curves Smoker:  no  Health Maintenance: Pap:  06/25/14 Neg History of abnormal Pap:  Yes, Hx of endometrial cancer 1996 MMG: 04/16/15 BIRADS1:neg Colonoscopy:  2011 Normal - repeat 5 years.  Pt had received a letter.   BMD:   11/08/2005 Normal  TDaP:  07/2007  Screening Labs: Here, Hb today: 13.4, Urine today: negative   reports that she has never smoked. She has never used smokeless tobacco. She reports that she does not drink alcohol or use illicit drugs.  Past Medical History  Diagnosis Date  . Osteopenia   . Fibrocystic breast changes   . Endometrial cancer [facility])     adenocarcinoma    Past Surgical History  Procedure Laterality Date  . Tubal ligation    . Total abdominal hysterectomy      BSO    Current Outpatient Prescriptions  Medication Sig Dispense Refill  . ALPRAZolam (XANAX) 0.5 MG tablet TAKE 1 TABLET AT BEDTIME AS NEEDED FOR ANXIETY 30 tablet 0  . CALCIUM PO Take by mouth daily.    Marland Kitchen estradiol (VIVELLE-DOT) 0.05 MG/24HR patch Place 1 patch (0.05 mg total) onto the skin 2 (two) times a week. 8 patch 12  . Fexofenadine-Pseudoephedrine (ALLEGRA-D PO) Take by mouth as needed.    . naproxen (NAPROSYN) 500 MG tablet Take 1 tablet (500 mg total) by mouth 2 (two) times daily with a meal. 30 tablet 0  . OMEPRAZOLE PO Take by mouth daily.    . valACYclovir (VALTREX) 500 MG tablet Take 1 tablet (500 mg total) by mouth daily. 30 tablet 13   No current facility-administered medications for this visit.    Family History  Problem Relation  Age of Onset  . Diabetes Maternal Grandfather   . Colon cancer Father   . Colon cancer Other     paternal great uncle  . Hypertension Mother     ROS:  Pertinent items are noted in HPI.  Otherwise, a comprehensive ROS was negative.  Exam:   BP 118/66 mmHg  Pulse 66  Resp 14  Ht 5\' 5"  (1.651 m)  Wt 174 lb (78.926 kg)  BMI 28.96 kg/m2  LMP 07/20/1994  Weight change:   Height: 5\' 5"  (165.1 cm)  Ht Readings from Last 3 Encounters:  08/30/15 5\' 5"  (1.651 m)  06/25/14 5\' 5"  (1.651 m)  05/26/13 5' 5.25" (1.657 m)   General appearance: alert, cooperative and appears stated age Head: Normocephalic, without obvious abnormality, atraumatic Neck: no adenopathy, supple, symmetrical, trachea midline and thyroid normal to inspection and palpation Lungs: clear to auscultation bilaterally Breasts: normal appearance, no masses or tenderness Heart: regular rate and rhythm Abdomen: soft, non-tender; bowel sounds normal; no masses,  no organomegaly Extremities: extremities normal, atraumatic, no cyanosis or edema Skin: Skin color, texture, turgor normal. No rashes or lesions Lymph nodes: Cervical, supraclavicular, and axillary nodes normal. No abnormal inguinal nodes palpated Neurologic: Grossly normal   Pelvic: External genitalia:  no lesions  Urethra:  normal appearing urethra with no masses, tenderness or lesions              Bartholins and Skenes: normal                 Vagina: normal appearing vagina with normal color and discharge, no lesions              Cervix: absent              Pap taken: Yes.   Bimanual Exam:  Uterus:  uterus absent              Adnexa: no mass, fullness, tenderness               Rectovaginal: Confirms               Anus:  normal sphincter tone, no lesions  Chaperone was present for exam.  A:  Well Woman with normal exam H/O adenocarcinoma (Stage IIB), s/p TAH/BSO 1996 H/O colon, breast, stomach cancer Fibrocystic Breast Disease H/O Vit D  deficiency. Has declined treatment but wants to have this retested  P: Mammogram yearly CMP, TSH, Lipids , Vit D, and CBC today pap smear today Vivelle 0.075mg  patches twice weekly. #24/4RF Valtrex 500mg  daily. #90/4 RF On Xanax 0.5mg .  No RF needed. BMD with next MMG Colonoscopy due.  Pt states she will schedule. return annually or prn

## 2015-08-31 LAB — VITAMIN D 25 HYDROXY (VIT D DEFICIENCY, FRACTURES): Vit D, 25-Hydroxy: 18 ng/mL — ABNORMAL LOW (ref 30–100)

## 2015-09-02 LAB — IPS PAP TEST WITH REFLEX TO HPV

## 2015-09-03 ENCOUNTER — Other Ambulatory Visit: Payer: Self-pay | Admitting: Obstetrics & Gynecology

## 2015-09-03 NOTE — Telephone Encounter (Signed)
Correction made in epic. Encounter closed.

## 2015-09-03 NOTE — Telephone Encounter (Signed)
Patient called and said, "The correct dosage for Omeprazole is 20 mg. I needed to call back with this information so refills can be sent into my pharmacy on file."

## 2015-09-03 NOTE — Telephone Encounter (Signed)
Patient would like a 90 day refill to her pharmacy. Advised pt this is an over the counter medication but she still wants a prescription sent to pharmacy.  PCP used to give Rx but she has not seen them in a while.   Please advise.

## 2015-09-03 NOTE — Addendum Note (Signed)
Addended by: Elroy Channel on: 09/03/2015 09:37 AM   Modules accepted: Orders

## 2015-09-04 MED ORDER — OMEPRAZOLE 20 MG PO CPDR: 20.0000 mg | DELAYED_RELEASE_CAPSULE | Freq: Every day | ORAL | Status: AC

## 2015-09-04 NOTE — Telephone Encounter (Signed)
Pt notified that rx was sent to pharmacy

## 2015-09-07 NOTE — Progress Notes (Signed)
Quick Note:  02 recall. Inform pt pap is normal. Also inform her CBC is normal, TSH normal, CMP normal. Vit D continues to be low but she has declined being treated for this in the past. Also, total cholesterol and LDLs are normal. However, HDLs are low. This does increase her risk for cardiovascular disease. Exercise will increase the HDLs. Should work on that this year. Repeat one year. ______

## 2015-09-09 ENCOUNTER — Other Ambulatory Visit: Payer: Self-pay | Admitting: *Deleted

## 2015-09-09 MED ORDER — VITAMIN D (ERGOCALCIFEROL) 1.25 MG (50000 UNIT) PO CAPS: 50000.0000 [IU] | ORAL_CAPSULE | ORAL | Status: DC

## 2015-09-24 DIAGNOSIS — K219 Gastro-esophageal reflux disease without esophagitis: Secondary | ICD-10-CM | POA: Insufficient documentation

## 2015-09-24 DIAGNOSIS — G5760 Lesion of plantar nerve, unspecified lower limb: Secondary | ICD-10-CM | POA: Insufficient documentation

## 2015-11-12 ENCOUNTER — Other Ambulatory Visit: Payer: Self-pay | Admitting: Obstetrics & Gynecology

## 2015-11-12 NOTE — Telephone Encounter (Signed)
Medication refill request: Xanax Last AEX:  08-30-15  Next AEX: 12-11-16  Last MMG (if hormonal medication request): 04-15-15 WNL Refill authorized: please advise  Sending to BS since SM is out of the office

## 2015-11-13 NOTE — Telephone Encounter (Signed)
RX faxed to pharmacy -eh

## 2015-12-13 ENCOUNTER — Other Ambulatory Visit: Payer: Self-pay | Admitting: Emergency Medicine

## 2015-12-13 ENCOUNTER — Other Ambulatory Visit (INDEPENDENT_AMBULATORY_CARE_PROVIDER_SITE_OTHER): Payer: PRIVATE HEALTH INSURANCE

## 2015-12-13 ENCOUNTER — Other Ambulatory Visit: Payer: PRIVATE HEALTH INSURANCE

## 2015-12-13 DIAGNOSIS — R7989 Other specified abnormal findings of blood chemistry: Secondary | ICD-10-CM

## 2015-12-13 DIAGNOSIS — Z0000 Encounter for general adult medical examination without abnormal findings: Secondary | ICD-10-CM

## 2015-12-14 LAB — VITAMIN D 25 HYDROXY (VIT D DEFICIENCY, FRACTURES): VIT D 25 HYDROXY: 51 ng/mL (ref 30–100)

## 2015-12-21 ENCOUNTER — Emergency Department (HOSPITAL_COMMUNITY): Payer: PRIVATE HEALTH INSURANCE

## 2015-12-21 ENCOUNTER — Emergency Department (HOSPITAL_COMMUNITY)
Admission: EM | Admit: 2015-12-21 | Discharge: 2015-12-21 | Disposition: A | Discharge status: Home / Self Care | Payer: PRIVATE HEALTH INSURANCE | Attending: Emergency Medicine | Admitting: Emergency Medicine

## 2015-12-21 ENCOUNTER — Encounter (HOSPITAL_COMMUNITY): Payer: Self-pay

## 2015-12-21 DIAGNOSIS — S3992XA Unspecified injury of lower back, initial encounter: Secondary | ICD-10-CM | POA: Insufficient documentation

## 2015-12-21 DIAGNOSIS — M8580 Other specified disorders of bone density and structure, unspecified site: Secondary | ICD-10-CM | POA: Insufficient documentation

## 2015-12-21 DIAGNOSIS — R0781 Pleurodynia: Secondary | ICD-10-CM

## 2015-12-21 DIAGNOSIS — S301XXA Contusion of abdominal wall, initial encounter: Secondary | ICD-10-CM | POA: Diagnosis not present

## 2015-12-21 DIAGNOSIS — Z791 Long term (current) use of non-steroidal anti-inflammatories (NSAID): Secondary | ICD-10-CM | POA: Diagnosis not present

## 2015-12-21 DIAGNOSIS — S2002XA Contusion of left breast, initial encounter: Secondary | ICD-10-CM | POA: Insufficient documentation

## 2015-12-21 DIAGNOSIS — Z79899 Other long term (current) drug therapy: Secondary | ICD-10-CM | POA: Diagnosis not present

## 2015-12-21 DIAGNOSIS — Y998 Other external cause status: Secondary | ICD-10-CM | POA: Diagnosis not present

## 2015-12-21 DIAGNOSIS — S29001A Unspecified injury of muscle and tendon of front wall of thorax, initial encounter: Secondary | ICD-10-CM | POA: Insufficient documentation

## 2015-12-21 DIAGNOSIS — Z8742 Personal history of other diseases of the female genital tract: Secondary | ICD-10-CM | POA: Insufficient documentation

## 2015-12-21 DIAGNOSIS — Z880 Allergy status to penicillin: Secondary | ICD-10-CM | POA: Diagnosis not present

## 2015-12-21 DIAGNOSIS — S299XXA Unspecified injury of thorax, initial encounter: Secondary | ICD-10-CM

## 2015-12-21 DIAGNOSIS — Y9389 Activity, other specified: Secondary | ICD-10-CM | POA: Diagnosis not present

## 2015-12-21 DIAGNOSIS — Z8542 Personal history of malignant neoplasm of other parts of uterus: Secondary | ICD-10-CM | POA: Insufficient documentation

## 2015-12-21 DIAGNOSIS — Y92410 Unspecified street and highway as the place of occurrence of the external cause: Secondary | ICD-10-CM | POA: Insufficient documentation

## 2015-12-21 LAB — I-STAT CHEM 8, ED
BUN: 16 mg/dL (ref 6–20)
Calcium, Ion: 1.21 mmol/L (ref 1.13–1.30)
Chloride: 102 mmol/L (ref 101–111)
Creatinine, Ser: 1 mg/dL (ref 0.44–1.00)
Glucose, Bld: 98 mg/dL (ref 65–99)
HCT: 39 % (ref 36.0–46.0)
Hemoglobin: 13.3 g/dL (ref 12.0–15.0)
Potassium: 3.8 mmol/L (ref 3.5–5.1)
Sodium: 141 mmol/L (ref 135–145)
TCO2: 26 mmol/L (ref 0–100)

## 2015-12-21 MED ORDER — IOPAMIDOL (ISOVUE-300) INJECTION 61%
75.0000 mL | Freq: Once | INTRAVENOUS | Status: AC | PRN
  Administered 2015-12-21: 75 mL via INTRAVENOUS

## 2015-12-21 MED ORDER — HYDROCODONE-ACETAMINOPHEN 5-325 MG PO TABS
1.0000 | ORAL_TABLET | Freq: Once | ORAL | Status: AC
  Administered 2015-12-21: 1 via ORAL
  Filled 2015-12-21: qty 1

## 2015-12-21 MED ORDER — CYCLOBENZAPRINE HCL 10 MG PO TABS: 10.0000 mg | ORAL_TABLET | Freq: Two times a day (BID) | ORAL | Status: DC | PRN

## 2015-12-21 MED ORDER — IOPAMIDOL (ISOVUE-300) INJECTION 61%
INTRAVENOUS | Status: AC
  Filled 2015-12-21: qty 75

## 2015-12-21 MED ORDER — ONDANSETRON HCL 4 MG/2ML IJ SOLN
4.0000 mg | Freq: Once | INTRAMUSCULAR | Status: AC
  Administered 2015-12-21: 4 mg via INTRAVENOUS
  Filled 2015-12-21: qty 2

## 2015-12-21 MED ORDER — HYDROCODONE-ACETAMINOPHEN 5-325 MG PO TABS: 2.0000 | ORAL_TABLET | ORAL | Status: DC | PRN
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
## 2015-12-26 ENCOUNTER — Telehealth: Payer: Self-pay | Admitting: *Deleted

## 2015-12-26 NOTE — Telephone Encounter (Signed)
Unable to leave voicemail. Left message to have patient call back.

## 2016-10-28 ENCOUNTER — Telehealth: Payer: Self-pay | Admitting: Obstetrics & Gynecology

## 2016-10-28 ENCOUNTER — Other Ambulatory Visit: Payer: Self-pay | Admitting: Obstetrics & Gynecology

## 2016-10-28 DIAGNOSIS — E2839 Other primary ovarian failure: Secondary | ICD-10-CM

## 2016-10-28 DIAGNOSIS — Z1231 Encounter for screening mammogram for malignant neoplasm of breast: Secondary | ICD-10-CM

## 2016-10-28 NOTE — Telephone Encounter (Addendum)
[facility] called back states [facility] said the order is to old and needs a new one put in the system.

## 2016-10-28 NOTE — Telephone Encounter (Signed)
Patient is not sure if Dr Sabra Heck wanted her to have a bone density scan this year.

## 2016-10-28 NOTE — Telephone Encounter (Signed)
Left detailed message at number provided [phone], okay per ROI. Advised per aex note from [facility] on 08/30/2015 she will need to have BMD exam with her next mammogram which is due at this time. May call and schedule BMD and mammogram at the same time. Advised to contact the office with any further questions.  Routing to provider for final review. Patient agreeable to disposition. Will close encounter.

## 2016-10-29 NOTE — Addendum Note (Signed)
Addended by: Gwendlyn Deutscher on: 10/29/2016 08:33 AM   Modules accepted: Orders

## 2016-10-29 NOTE — Telephone Encounter (Signed)
New order for BMD placed. Left detailed message at number provided [phone]. Advised new order has been placed for BMD and she may schedule at her convenience. Advised to return call to the office with any questions.  Encounter previously closed.

## 2016-11-18 ENCOUNTER — Ambulatory Visit
Admission: RE | Admit: 2016-11-18 | Discharge: 2016-11-18 | Disposition: A | Discharge status: Home / Self Care | Payer: Managed Care, Other (non HMO) | Source: Ambulatory Visit | Attending: Obstetrics & Gynecology | Admitting: Obstetrics & Gynecology

## 2016-11-18 ENCOUNTER — Ambulatory Visit
Admission: RE | Admit: 2016-11-18 | Discharge: 2016-11-18 | Disposition: A | Discharge status: Home / Self Care | Payer: PRIVATE HEALTH INSURANCE | Source: Ambulatory Visit | Attending: Obstetrics & Gynecology | Admitting: Obstetrics & Gynecology

## 2016-11-18 DIAGNOSIS — E2839 Other primary ovarian failure: Secondary | ICD-10-CM

## 2016-11-18 DIAGNOSIS — Z1231 Encounter for screening mammogram for malignant neoplasm of breast: Secondary | ICD-10-CM

## 2016-11-19 ENCOUNTER — Telehealth: Payer: Self-pay | Admitting: *Deleted

## 2016-11-19 NOTE — Telephone Encounter (Signed)
-----   Message from Megan Salon, MD sent at 11/19/2016  7:00 AM EDT ----- Please let pt know her bone density showed some osteopenia.  This is appropriate given she is not on HRT and is caucasian.  Would plan to repeat 3-4 years.  No treatment is needed.

## 2016-11-19 NOTE — Telephone Encounter (Signed)
LM for pt to call back.

## 2016-11-19 NOTE — Telephone Encounter (Signed)
Pt notified.  Verbalized understanding.

## 2016-12-11 ENCOUNTER — Ambulatory Visit (INDEPENDENT_AMBULATORY_CARE_PROVIDER_SITE_OTHER): Payer: Managed Care, Other (non HMO) | Admitting: Obstetrics & Gynecology

## 2016-12-11 ENCOUNTER — Encounter: Payer: Self-pay | Admitting: Obstetrics & Gynecology

## 2016-12-11 ENCOUNTER — Other Ambulatory Visit (HOSPITAL_COMMUNITY)
Admission: RE | Admit: 2016-12-11 | Discharge: 2016-12-11 | Disposition: A | Discharge status: Home / Self Care | Payer: Managed Care, Other (non HMO) | Source: Ambulatory Visit | Attending: Obstetrics & Gynecology | Admitting: Obstetrics & Gynecology

## 2016-12-11 VITALS — BP 118/60 | HR 90 | Resp 16 | Ht 65.0 in | Wt 175.0 lb

## 2016-12-11 DIAGNOSIS — Z01419 Encounter for gynecological examination (general) (routine) without abnormal findings: Secondary | ICD-10-CM | POA: Diagnosis not present

## 2016-12-11 DIAGNOSIS — Z124 Encounter for screening for malignant neoplasm of cervix: Secondary | ICD-10-CM | POA: Diagnosis present

## 2016-12-11 DIAGNOSIS — Z23 Encounter for immunization: Secondary | ICD-10-CM | POA: Diagnosis not present

## 2016-12-11 DIAGNOSIS — Z0000 Encounter for general adult medical examination without abnormal findings: Secondary | ICD-10-CM

## 2016-12-11 LAB — CBC
HCT: 40 % (ref 35.0–45.0)
Hemoglobin: 13.4 g/dL (ref 11.7–15.5)
MCH: 31.6 pg (ref 27.0–33.0)
MCHC: 33.5 g/dL (ref 32.0–36.0)
MCV: 94.3 fL (ref 80.0–100.0)
MPV: 10.7 fL (ref 7.5–12.5)
PLATELETS: 204 10*3/uL (ref 140–400)
RBC: 4.24 MIL/uL (ref 3.80–5.10)
RDW: 12.9 % (ref 11.0–15.0)
WBC: 5.9 10*3/uL (ref 3.8–10.8)

## 2016-12-11 LAB — TSH: TSH: 2.08 mIU/L

## 2016-12-11 MED ORDER — ESTRADIOL 0.075 MG/24HR TD PTTW: 1.0000 | MEDICATED_PATCH | TRANSDERMAL | 4 refills | Status: DC

## 2016-12-11 MED ORDER — VALACYCLOVIR HCL 500 MG PO TABS: 500.0000 mg | ORAL_TABLET | Freq: Every day | ORAL | 4 refills | Status: DC

## 2016-12-11 MED ORDER — ALPRAZOLAM 0.5 MG PO TABS: 0.5000 mg | ORAL_TABLET | Freq: Every evening | ORAL | 1 refills | Status: DC | PRN

## 2016-12-11 NOTE — Progress Notes (Addendum)
61 y.o. G2P2 MarriedCaucasianF here for annual exam.  Doing well.  No vaginal bleeding.  Still doesn't have PCP.  Family is doing well.    Patient's last menstrual period was 07/20/1994.          Sexually active: Yes.    The current method of family planning is status post hysterectomy.    Exercising: No.  The patient does not participate in regular exercise at present. Smoker:  no  Health Maintenance: Pap:  08/30/15 Neg  06/25/14 Neg  History of abnormal Pap:  Yes,  Cervical Cancer 1996  MMG:  11/18/16 BIRADS1:Neg Colonoscopy:  2011 Normal. F/u 5 years.  Pt aware this is due.  States she will do this next year. BMD:   11/18/16 Osteopenia  TDaP:  07/2007  Pneumonia vaccine(s):  No Zostavax:   09/25/15  Hep C testing: unsure Screening Labs: obtained today   reports that she has never smoked. She has never used smokeless tobacco. She reports that she does not drink alcohol or use drugs.  Past Medical History:  Diagnosis Date  . Endometrial cancer ([place])    adenocarcinoma  . Fibrocystic breast changes   . Osteopenia     Past Surgical History:  Procedure Laterality Date  . TOTAL ABDOMINAL HYSTERECTOMY     BSO  . TUBAL LIGATION      Current Outpatient Prescriptions  Medication Sig Dispense Refill  . ALPRAZolam (XANAX) 0.5 MG tablet TAKE 1 TABLET BY MOUTH DAILY AT BEDTIME AS NEEDED FOR ANXIETY 30 tablet 0  . CALCIUM PO Take by mouth daily.    . clobetasol (TEMOVATE) 0.05 % external solution Apply topically.    . cyclobenzaprine (FLEXERIL) 10 MG tablet Take 1 tablet (10 mg total) by mouth 2 (two) times daily as needed for muscle spasms. 14 tablet 0  . estradiol (VIVELLE-DOT) 0.075 MG/24HR Place 1 patch onto the skin 2 (two) times a week. 24 patch 4  . Fexofenadine-Pseudoephedrine (ALLEGRA-D PO) Take by mouth as needed.    Marland Kitchen HYDROcodone-acetaminophen (NORCO/VICODIN) 5-325 MG tablet Take 2 tablets by mouth every 4 (four) hours as needed. 10 tablet 0  . naproxen (NAPROSYN) 500 MG tablet  Take 1 tablet (500 mg total) by mouth 2 (two) times daily with a meal. 30 tablet 0  . omeprazole (PRILOSEC) 20 MG capsule Take 1 capsule (20 mg total) by mouth daily. 90 capsule 4  . triamcinolone (NASACORT) 55 MCG/ACT AERO nasal inhaler Place into the nose as needed.    . triamcinolone cream (KENALOG) 0.1 % Apply topically.    . valACYclovir (VALTREX) 500 MG tablet Take 1 tablet (500 mg total) by mouth daily. 90 tablet 4  . Vitamin D, Ergocalciferol, (DRISDOL) 50000 units CAPS capsule Take 1 capsule (50,000 Units total) by mouth every 7 (seven) days. 12 capsule 0   No current facility-administered medications for this visit.     Family History  Problem Relation Age of Onset  . Diabetes Maternal Grandfather   . Colon cancer Father   . Colon cancer Other        paternal great uncle  . Hypertension Mother   . Breast cancer Maternal Aunt     ROS:  Pertinent items are noted in HPI.  Otherwise, a comprehensive ROS was negative.  Exam:   BP 118/60 (BP Location: Right Arm, Patient Position: Sitting, Cuff Size: Large)   Pulse 90   Resp 16   Ht 5\' 5"  (1.651 m)   Wt 175 lb (79.4 kg)   LMP  07/20/1994   BMI 29.12 kg/m   Weight change:  +1#  Height: 5\' 5"  (165.1 cm)  Ht Readings from Last 3 Encounters:  12/11/16 5\' 5"  (1.651 m)  12/21/15 5' 5.5" (1.664 m)  08/30/15 5\' 5"  (1.651 m)    General appearance: alert, cooperative and appears stated age Head: Normocephalic, without obvious abnormality, atraumatic Neck: no adenopathy, supple, symmetrical, trachea midline and thyroid normal to inspection and palpation Lungs: clear to auscultation bilaterally Breasts: normal appearance, no masses or tenderness Heart: regular rate and rhythm Abdomen: soft, non-tender; bowel sounds normal; no masses,  no organomegaly Extremities: extremities normal, atraumatic, no cyanosis or edema Skin: Skin color, texture, turgor normal. No rashes or lesions Lymph nodes: Cervical, supraclavicular, and axillary  nodes normal. No abnormal inguinal nodes palpated Neurologic: Grossly normal   Pelvic: External genitalia:  no lesions              Urethra:  normal appearing urethra with no masses, tenderness or lesions              Bartholins and Skenes: normal                 Vagina: normal appearing vagina with normal color and discharge, no lesions              Cervix: absent              Pap taken: Yes.   Bimanual Exam:  Uterus:  uterus absent              Adnexa: no mass, fullness, tenderness               Rectovaginal: Confirms               Anus:  normal sphincter tone, no lesions  Chaperone was present for exam.  A:  Well Woman with normal exam H/O adenocarcinoma (stage IIB), s/p TAH/BSO 1996 H/O colon, breast, and stomach Vit D deficiency  P:   Mammogram yearly pap smear obtained today CMP, CBC, lipids, TSH.  Declines Vit  D supplementation so will not  Check this today. Valtrex 500mg  daily.  #90/4RF Vivelle 0.075mg  patch, one topically twice weekly.  #24/4RF.  Aware of risks including DVT/PE/stroke, breast cancer, MI.  Desires to continue. Xanax 0.5mg  1 tab po prn.  #30/1RF Tdap given today Order for shingrix vaccine given to pt today return annually or prn

## 2016-12-12 LAB — COMPREHENSIVE METABOLIC PANEL
ALT: 10 U/L (ref 6–29)
AST: 14 U/L (ref 10–35)
Albumin: 3.9 g/dL (ref 3.6–5.1)
Alkaline Phosphatase: 66 U/L (ref 33–130)
BUN: 14 mg/dL (ref 7–25)
CHLORIDE: 102 mmol/L (ref 98–110)
CO2: 26 mmol/L (ref 20–31)
CREATININE: 0.92 mg/dL (ref 0.50–0.99)
Calcium: 8.7 mg/dL (ref 8.6–10.4)
Glucose, Bld: 76 mg/dL (ref 65–99)
POTASSIUM: 4.2 mmol/L (ref 3.5–5.3)
Sodium: 138 mmol/L (ref 135–146)
TOTAL PROTEIN: 6.2 g/dL (ref 6.1–8.1)
Total Bilirubin: 0.5 mg/dL (ref 0.2–1.2)

## 2016-12-12 LAB — LIPID PANEL
CHOL/HDL RATIO: 5.1 ratio — AB (ref <5.0)
Cholesterol: 147 mg/dL (ref <200)
HDL: 29 mg/dL — ABNORMAL LOW (ref >50)
LDL CALC: 82 mg/dL (ref <100)
Triglycerides: 179 mg/dL — ABNORMAL HIGH (ref <150)
VLDL: 36 mg/dL — AB (ref <30)

## 2016-12-15 ENCOUNTER — Telehealth: Payer: Self-pay | Admitting: *Deleted

## 2016-12-15 NOTE — Telephone Encounter (Signed)
LM for pt to call back.

## 2016-12-15 NOTE — Telephone Encounter (Signed)
-----   Message from Megan Salon, MD sent at 12/15/2016 11:29 AM EDT ----- Please let pt know her CMP, CBC, TSH were normal.  Her cholesterol is ok with normal total but low HDLs.  This is an independent risk factor for development of cardiovascular disease.  She does not want any treatment for this.  I would recommend having a discussiong with her PCP about this but she will likely decline this.

## 2016-12-16 LAB — CYTOLOGY - PAP
Diagnosis: NEGATIVE
HPV (WINDOPATH): NOT DETECTED

## 2016-12-16 NOTE — Telephone Encounter (Signed)
Patient has been informed of pap smear results. Patient verbalized understanding and agreement. Patient is not having any symptoms and does not want any medication called out at this time for yeast.

## 2016-12-16 NOTE — Telephone Encounter (Signed)
Patient returned call to [place]. Advised Elie Goody is out of the office 12/16/16. The following results were discussed in detail with patient.   Patient agreeable to disposition. Will close encounter.

## 2017-06-24 ENCOUNTER — Other Ambulatory Visit: Payer: Self-pay | Admitting: Obstetrics & Gynecology

## 2017-06-24 NOTE — Telephone Encounter (Signed)
Medication refill request: Alprazolam Last AEX:  12/11/16 SM Next AEX: 03/04/18 Last MMG (if hormonal medication request): 11/18/16 BIRADS  1 negative/density b Refill authorized: 12/11/16 #30 w/1 refill; today please advise

## 2017-06-24 NOTE — Telephone Encounter (Signed)
Patient is asking for a refill of alprazolam to the pharmacy on file. °

## 2017-06-25 MED ORDER — ALPRAZOLAM 0.5 MG PO TABS: 0.5000 mg | ORAL_TABLET | Freq: Every evening | ORAL | 1 refills | Status: DC | PRN

## 2017-06-25 NOTE — Telephone Encounter (Signed)
Refill completed.  Will need to be faxed to [facility].  Thanks.

## 2017-06-29 NOTE — Telephone Encounter (Signed)
Prescription faxed to pharmacy.

## 2017-10-16 ENCOUNTER — Other Ambulatory Visit: Payer: Self-pay | Admitting: Obstetrics & Gynecology

## 2017-10-18 ENCOUNTER — Other Ambulatory Visit: Payer: Self-pay | Admitting: Obstetrics & Gynecology

## 2017-10-18 MED ORDER — VALACYCLOVIR HCL 500 MG PO TABS: 500.0000 mg | ORAL_TABLET | Freq: Every day | ORAL | 0 refills | Status: DC

## 2017-10-18 MED ORDER — ALPRAZOLAM 0.5 MG PO TABS: 0.5000 mg | ORAL_TABLET | Freq: Every evening | ORAL | 0 refills | Status: DC | PRN

## 2017-10-18 NOTE — Telephone Encounter (Signed)
Message noted from Dr. Sabra Heck.

## 2017-10-18 NOTE — Telephone Encounter (Signed)
Separate orders for these have been done as the pharmacy was changed and cannot be done that way for a refill request initiated by a pharmacy.  These medications declined.

## 2017-10-18 NOTE — Telephone Encounter (Signed)
Message left for patient to return call to [facility].

## 2017-10-18 NOTE — Telephone Encounter (Signed)
Medication refill request: Xanax Last AEX:  12/11/16 SM  Next AEX: 03/04/18  Last MMG (if hormonal medication request): 11/18/16 BIRADS 1 negative  Refill authorized: 06/25/17 #30, 1 RF. Today, please advise.   Medication refill request: Valtrex Refill authorized: 12/11/16 #90, 4RF. Today, please advise.   Spoke with patient. Patient states that she needs refill for xanax to go to [facility] on [address] and requests refill for Valtrex to go to [place] home delivery.

## 2017-10-27 ENCOUNTER — Telehealth: Payer: Self-pay | Admitting: Obstetrics & Gynecology

## 2017-10-27 NOTE — Telephone Encounter (Signed)
Left message on voicemail to call and reschedule cancelled appointment. °

## 2017-12-08 ENCOUNTER — Other Ambulatory Visit: Payer: Self-pay | Admitting: Obstetrics & Gynecology

## 2017-12-08 NOTE — Telephone Encounter (Signed)
Medication refill request: Vivelle Patch  Last AEX:  12-11-16  Next AEX: 05-24-18  Last MMG (if hormonal medication request): 11-18-16 WNL  Refill authorized: please advise

## 2017-12-08 NOTE — Telephone Encounter (Signed)
Patient is asking for one refill of her patches to the pharmacy on file. The patient asked for the remaining refills to be sent to her [facility] mail order pharmacy until her aex 05/24/18.

## 2017-12-10 MED ORDER — ESTRADIOL 0.075 MG/24HR TD PTTW: 1.0000 | MEDICATED_PATCH | TRANSDERMAL | 0 refills | Status: DC

## 2017-12-10 NOTE — Addendum Note (Signed)
Addended by: Megan Salon on: 12/10/2017 05:22 PM   Modules accepted: Orders

## 2017-12-10 NOTE — Telephone Encounter (Signed)
Patient calling to follow up about prescription refill.

## 2017-12-29 ENCOUNTER — Other Ambulatory Visit: Payer: Self-pay | Admitting: Obstetrics & Gynecology

## 2017-12-29 DIAGNOSIS — Z1231 Encounter for screening mammogram for malignant neoplasm of breast: Secondary | ICD-10-CM

## 2018-01-10 ENCOUNTER — Other Ambulatory Visit: Payer: Self-pay | Admitting: Obstetrics & Gynecology

## 2018-01-11 NOTE — Telephone Encounter (Signed)
Medication refill request: Xanax Last AEX:  12-11-16  Next AEX: 05-24-18  Last MMG (if hormonal medication request): 11-18-16 WNL  Refill authorized: please advise

## 2018-01-31 ENCOUNTER — Ambulatory Visit
Admission: RE | Admit: 2018-01-31 | Discharge: 2018-01-31 | Disposition: A | Discharge status: Home / Self Care | Payer: Managed Care, Other (non HMO) | Source: Ambulatory Visit | Attending: Obstetrics & Gynecology | Admitting: Obstetrics & Gynecology

## 2018-01-31 DIAGNOSIS — Z1231 Encounter for screening mammogram for malignant neoplasm of breast: Secondary | ICD-10-CM

## 2018-02-02 ENCOUNTER — Other Ambulatory Visit: Payer: Self-pay | Admitting: Obstetrics & Gynecology

## 2018-02-02 DIAGNOSIS — R928 Other abnormal and inconclusive findings on diagnostic imaging of breast: Secondary | ICD-10-CM

## 2018-02-04 ENCOUNTER — Ambulatory Visit (INDEPENDENT_AMBULATORY_CARE_PROVIDER_SITE_OTHER): Payer: Managed Care, Other (non HMO) | Admitting: Obstetrics & Gynecology

## 2018-02-04 ENCOUNTER — Ambulatory Visit
Admission: RE | Admit: 2018-02-04 | Discharge: 2018-02-04 | Disposition: A | Discharge status: Home / Self Care | Payer: Managed Care, Other (non HMO) | Source: Ambulatory Visit | Attending: Obstetrics & Gynecology | Admitting: Obstetrics & Gynecology

## 2018-02-04 ENCOUNTER — Other Ambulatory Visit (HOSPITAL_COMMUNITY)
Admission: RE | Admit: 2018-02-04 | Discharge: 2018-02-04 | Disposition: A | Discharge status: Home / Self Care | Payer: Managed Care, Other (non HMO) | Source: Ambulatory Visit | Attending: Obstetrics & Gynecology | Admitting: Obstetrics & Gynecology

## 2018-02-04 ENCOUNTER — Other Ambulatory Visit: Payer: Self-pay

## 2018-02-04 ENCOUNTER — Encounter: Payer: Self-pay | Admitting: Obstetrics & Gynecology

## 2018-02-04 VITALS — BP 118/64 | HR 84 | Resp 16 | Ht 65.25 in | Wt 185.8 lb

## 2018-02-04 DIAGNOSIS — R928 Other abnormal and inconclusive findings on diagnostic imaging of breast: Secondary | ICD-10-CM

## 2018-02-04 DIAGNOSIS — Z0000 Encounter for general adult medical examination without abnormal findings: Secondary | ICD-10-CM | POA: Diagnosis not present

## 2018-02-04 DIAGNOSIS — Z205 Contact with and (suspected) exposure to viral hepatitis: Secondary | ICD-10-CM

## 2018-02-04 DIAGNOSIS — Z01419 Encounter for gynecological examination (general) (routine) without abnormal findings: Secondary | ICD-10-CM | POA: Diagnosis not present

## 2018-02-04 DIAGNOSIS — Z124 Encounter for screening for malignant neoplasm of cervix: Secondary | ICD-10-CM | POA: Diagnosis not present

## 2018-02-04 MED ORDER — VALACYCLOVIR HCL 500 MG PO TABS: 500.0000 mg | ORAL_TABLET | Freq: Every day | ORAL | 0 refills | Status: DC

## 2018-02-04 MED ORDER — ESTRADIOL 0.075 MG/24HR TD PTTW: 1.0000 | MEDICATED_PATCH | TRANSDERMAL | 4 refills | Status: DC

## 2018-02-04 NOTE — Progress Notes (Addendum)
62 y.o. G2P2 MarriedCaucasianF here for annual exam.  Doing well.  Had otitis media in April.  This has resolved.  Just had call back for MMG 01/31/18.  Possible mass noted.  Has mammogram and ultrasound scheduled today.    Denies vaginal bleeding.    Patient's last menstrual period was 07/20/1994.          Sexually active: Yes.    The current method of family planning is status post hysterectomy.    Exercising: No.   Smoker:  no  Health Maintenance: Pap:  12/11/16 Neg. HR HPV:neg   08/30/15 Neg History of abnormal Pap:  Yes, endometrial cancer 1996  MMG:  02/01/18 BIRADS0:incomplete. Has appt today Colonoscopy:  10/2017 polyp. F/u 5 years.  Has family hx of colon cancer. BMD:   11/18/16 Osteopenia  TDaP:  2018 Pneumonia vaccine(s):  No Shingrix:   Done  Hep C testing: obtained today Screening Labs: Here today    reports that she has never smoked. She has never used smokeless tobacco. She reports that she does not drink alcohol or use drugs.  Past Medical History:  Diagnosis Date  . Endometrial cancer ([place])    adenocarcinoma  . Fibrocystic breast changes   . Osteopenia     Past Surgical History:  Procedure Laterality Date  . TOTAL ABDOMINAL HYSTERECTOMY     BSO  . TUBAL LIGATION      Current Outpatient Medications  Medication Sig Dispense Refill  . ALPRAZolam (XANAX) 0.5 MG tablet TAKE 1 TABLET BY MOUTH ONCE DAILY AT BEDTIME AS NEEDED FOR ANXIETY 30 tablet 0  . CALCIUM PO Take by mouth daily.    . clobetasol (TEMOVATE) 0.05 % external solution Apply topically.    Marland Kitchen estradiol (VIVELLE-DOT) 0.075 MG/24HR Place 1 patch onto the skin 2 (two) times a week. 8 patch 0  . Fexofenadine-Pseudoephedrine (ALLEGRA-D PO) Take by mouth as needed.    Marland Kitchen HYDROcodone-acetaminophen (NORCO/VICODIN) 5-325 MG tablet daily as needed.    Marland Kitchen ibuprofen (ADVIL,MOTRIN) 200 MG tablet Take by mouth daily as needed.    . naproxen (NAPROSYN) 500 MG tablet Take 1 tablet (500 mg total) by mouth 2 (two) times  daily with a meal. 30 tablet 0  . omeprazole (PRILOSEC) 20 MG capsule Take 1 capsule (20 mg total) by mouth daily. 90 capsule 4  . triamcinolone (NASACORT) 55 MCG/ACT AERO nasal inhaler Place into the nose as needed.    . triamcinolone cream (KENALOG) 0.1 % Apply topically.    . valACYclovir (VALTREX) 500 MG tablet Take 1 tablet (500 mg total) by mouth daily. 90 tablet 0   No current facility-administered medications for this visit.     Family History  Problem Relation Age of Onset  . Diabetes Maternal Grandfather   . Colon cancer Father   . Colon cancer Other        paternal great uncle  . Hypertension Mother   . Breast cancer Maternal Aunt     Review of Systems  All other systems reviewed and are negative.   Exam:   BP 118/64 (BP Location: Right Arm, Patient Position: Sitting, Cuff Size: Large)   Pulse 84   Resp 16   Ht 5' 5.25" (1.657 m)   Wt 185 lb 12.8 oz (84.3 kg)   LMP 07/20/1994   BMI 30.68 kg/m    Height: 5' 5.25" (165.7 cm)  Ht Readings from Last 3 Encounters:  02/04/18 5' 5.25" (1.657 m)  12/11/16 5\' 5"  (1.651 m)  12/21/15 5'  5.5" (1.664 m)    General appearance: alert, cooperative and appears stated age Head: Normocephalic, without obvious abnormality, atraumatic Neck: no adenopathy, supple, symmetrical, trachea midline and thyroid normal to inspection and palpation Lungs: clear to auscultation bilaterally Breasts: normal appearance, no masses or tenderness Heart: regular rate and rhythm Abdomen: soft, non-tender; bowel sounds normal; no masses,  no organomegaly Extremities: extremities normal, atraumatic, no cyanosis or edema Skin: Skin color, texture, turgor normal. No rashes or lesions Lymph nodes: Cervical, supraclavicular, and axillary nodes normal. No abnormal inguinal nodes palpated Neurologic: Grossly normal   Pelvic: External genitalia:  no lesions              Urethra:  normal appearing urethra with no masses, tenderness or lesions               Bartholins and Skenes: normal                 Vagina: normal appearing vagina with normal color and discharge, no lesions              Cervix: absent              Pap taken: Yes.   Bimanual Exam:  Uterus:  uterus absent              Adnexa: no mass, fullness, tenderness               Rectovaginal: Confirms               Anus:  normal sphincter tone, no lesions  Chaperone was present for exam.  A:  Well Woman with normal exam PMP, no HRT H/O adenocarcinoma Stage IIB, s/p TAH/BSO 1996 H/O Vit D Recent abnormal MMG with follow up scheduled today  P:   Mammogram guidelines reviewed.  pap smear obtained today RF for vivelle dot 0.075mg  patches twice weekly.  #24/4RF.  Risks reviewed. Valtrex 500mg  daily. #90/4RF Lipids, CMP, and Vit D Hep C antibody obtained today Does not need refill for Xanax return annually or prn

## 2018-02-04 NOTE — Patient Instructions (Signed)
For fever blisters--take 2 tabs and repeat in 12 hours

## 2018-02-05 LAB — LIPID PANEL
CHOL/HDL RATIO: 5 ratio — AB (ref 0.0–4.4)
Cholesterol, Total: 171 mg/dL (ref 100–199)
HDL: 34 mg/dL — ABNORMAL LOW (ref >39)
LDL CALC: 123 mg/dL — AB (ref 0–99)
Triglycerides: 72 mg/dL (ref 0–149)
VLDL CHOLESTEROL CAL: 14 mg/dL (ref 5–40)

## 2018-02-05 LAB — COMPREHENSIVE METABOLIC PANEL
ALBUMIN: 4.6 g/dL (ref 3.6–4.8)
ALK PHOS: 65 IU/L (ref 39–117)
ALT: 17 IU/L (ref 0–32)
AST: 18 IU/L (ref 0–40)
Albumin/Globulin Ratio: 1.9 (ref 1.2–2.2)
BILIRUBIN TOTAL: 0.6 mg/dL (ref 0.0–1.2)
BUN / CREAT RATIO: 15 (ref 12–28)
BUN: 13 mg/dL (ref 8–27)
CO2: 24 mmol/L (ref 20–29)
CREATININE: 0.85 mg/dL (ref 0.57–1.00)
Calcium: 9.6 mg/dL (ref 8.7–10.3)
Chloride: 102 mmol/L (ref 96–106)
GFR calc non Af Amer: 74 mL/min/{1.73_m2} (ref >59)
GFR, EST AFRICAN AMERICAN: 85 mL/min/{1.73_m2} (ref >59)
GLOBULIN, TOTAL: 2.4 g/dL (ref 1.5–4.5)
Glucose: 80 mg/dL (ref 65–99)
Potassium: 3.9 mmol/L (ref 3.5–5.2)
SODIUM: 142 mmol/L (ref 134–144)
Total Protein: 7 g/dL (ref 6.0–8.5)

## 2018-02-05 LAB — HEPATITIS C ANTIBODY: Hep C Virus Ab: <0.1 s/co ratio (ref 0.0–0.9)

## 2018-02-05 LAB — VITAMIN D 25 HYDROXY (VIT D DEFICIENCY, FRACTURES): VIT D 25 HYDROXY: 26.2 ng/mL — AB (ref 30.0–100.0)

## 2018-02-07 ENCOUNTER — Telehealth: Payer: Self-pay | Admitting: *Deleted

## 2018-02-07 LAB — CYTOLOGY - PAP: DIAGNOSIS: NEGATIVE

## 2018-02-07 NOTE — Telephone Encounter (Signed)
Patient returning call for results. States she will be at lunch from 12:30-1, but can be reached at her work number after 1.

## 2018-02-07 NOTE — Telephone Encounter (Signed)
Patient returning Reina's call. Can call on her work number [phone].

## 2018-02-07 NOTE — Telephone Encounter (Signed)
-----   Message from Megan Salon, MD sent at 02/06/2018 11:37 PM EDT ----- Please let pt know her Hep c testing was negative.  As well her CMP was normal.  Vit D was 26.  Goal is >30.  Needs to make sure there is Vit D in the calcium she is taking and make sure taking regularly.  Total cholesterol was 171.  Triglycerides were normal.  LDLs 123 and HDLs are decreased which is a risk factor for heart disease.  However, using the ACC/AHA cardiovascular risk score, her risk for heart disease over the next 10 years if 4.5%.  Treatment is not recommended at this time based on this level.  Will plan to repeat her cholesterol fasting in one year.  Thanks.

## 2018-02-07 NOTE — Telephone Encounter (Signed)
Left voice mail to call back 

## 2018-02-07 NOTE — Telephone Encounter (Signed)
LM for pt to call back.

## 2018-02-07 NOTE — Telephone Encounter (Signed)
Called work number. Left voicemail to call back.

## 2018-02-08 NOTE — Telephone Encounter (Signed)
Patient notified. Health Form faxed and sent to scan.

## 2018-02-08 NOTE — Telephone Encounter (Signed)
Return call to Reina °

## 2018-03-04 ENCOUNTER — Ambulatory Visit: Payer: Managed Care, Other (non HMO) | Admitting: Obstetrics & Gynecology

## 2018-03-23 ENCOUNTER — Other Ambulatory Visit: Payer: Self-pay | Admitting: Obstetrics & Gynecology

## 2018-03-24 NOTE — Telephone Encounter (Signed)
Medication refill request: Xanax 0.5 Last AEX:  02/04/18 Next AEX: 05/26/19 Last MMG (if hormonal medication request): Bi-rads category 1 neg  Refill authorized: Please refill if appropriate.

## 2018-05-22 ENCOUNTER — Other Ambulatory Visit: Payer: Self-pay | Admitting: Obstetrics & Gynecology

## 2018-05-23 NOTE — Telephone Encounter (Signed)
Medication refill request: xanax  Last AEX:  02/04/18 SM Next AEX: 05/26/19 Last MMG (if hormonal medication request): 02/04/18 [place] Right BIRADS1:Neg. F/u 1 year  Refill authorized: 03/24/18 #30/0R. Today please advise.

## 2018-05-24 ENCOUNTER — Encounter

## 2018-05-24 ENCOUNTER — Ambulatory Visit: Payer: Managed Care, Other (non HMO) | Admitting: Obstetrics & Gynecology

## 2018-12-02 ENCOUNTER — Other Ambulatory Visit: Payer: Self-pay | Admitting: Obstetrics & Gynecology

## 2018-12-05 NOTE — Telephone Encounter (Signed)
Medication refill request: xanax 0.5mg  Last AEX:  02-04-18 Next AEX: 05-26-2019 Last MMG (if hormonal medication request): n/a Refill authorized: please approve if appropriate

## 2018-12-05 NOTE — Telephone Encounter (Signed)
rx faxed to pharmacy

## 2019-02-01 ENCOUNTER — Other Ambulatory Visit: Payer: Self-pay | Admitting: Obstetrics & Gynecology

## 2019-02-01 NOTE — Telephone Encounter (Signed)
Medication refill request: Xanax  Last AEX:  02/04/18 Next AEX: 05/26/19 Last MMG (if hormonal medication request): 02/01/18  Refill authorized: #30 with 0 RF

## 2019-03-31 ENCOUNTER — Other Ambulatory Visit: Payer: Self-pay | Admitting: Obstetrics & Gynecology

## 2019-03-31 NOTE — Telephone Encounter (Signed)
Medication refill request: xanax  Last AEX:  02/04/18 Next AEX: 05/26/19 Last MMG (if hormonal medication request): NA Refill authorized: #30 with 0 RF

## 2019-05-22 ENCOUNTER — Other Ambulatory Visit: Payer: Self-pay | Admitting: Obstetrics & Gynecology

## 2019-05-23 NOTE — Telephone Encounter (Signed)
Medication refill request: xanax Last AEX:  02/04/18 Next AEX:05/26/19 Last MMG (if hormonal medication request): 01/31/18  Refill authorized: #30 with 0 RF

## 2019-05-26 ENCOUNTER — Ambulatory Visit: Payer: Managed Care, Other (non HMO) | Admitting: Obstetrics & Gynecology

## 2019-08-29 ENCOUNTER — Other Ambulatory Visit: Payer: Self-pay | Admitting: Obstetrics & Gynecology

## 2019-08-30 NOTE — Telephone Encounter (Signed)
Medication refill request: Xanax Last AEX:  02-04-18 SM  Next AEX: message left for patient to call and schedule  Last MMG (if hormonal medication request): n/a Refill authorized: Today, please advise.   Medication pended for #30, 0RF. Please refill if appropriate.

## 2019-08-30 NOTE — Telephone Encounter (Signed)
Patient returning call to [facility]. Notified patient calling to schedule annual exam for prescription refill. States she is not getting pap done every year and is currently uninsured so she does not wish to schedule at this time.

## 2019-08-30 NOTE — Telephone Encounter (Signed)
Routing to Dr. Sabra Heck to review and advise on prescription.

## 2019-11-22 ENCOUNTER — Other Ambulatory Visit: Payer: Self-pay | Admitting: Obstetrics & Gynecology

## 2019-11-22 NOTE — Telephone Encounter (Signed)
Medication refill request: Alprazolam Last AEX:  02/04/18 SM Next AEX: none scheduled Last MMG (if hormonal medication request): 2019 Refill authorized: Today, please adivse  Left message on patient's voicmail to call me back to schedule an annual.

## 2019-11-23 NOTE — Telephone Encounter (Signed)
Rx declined.  She needs OV.

## 2019-11-29 ENCOUNTER — Other Ambulatory Visit: Payer: Self-pay

## 2019-11-29 MED ORDER — ALPRAZOLAM 0.5 MG PO TABS: ORAL_TABLET | ORAL | 0 refills | Status: DC

## 2019-11-29 NOTE — Telephone Encounter (Signed)
Patient requesting refill for Alprazolam. Per patient, unable to come for annual until after June 16, 2020 due to retiring and not having insurance. Patient will have [employer] after her birthday and requested an appointment for early December. Scheduled patient for 07/01/20. Please advise on refill. Order pended for #30 w/0 refills if authorized.

## 2020-02-03 ENCOUNTER — Other Ambulatory Visit: Payer: Self-pay | Admitting: Obstetrics & Gynecology

## 2020-02-05 NOTE — Telephone Encounter (Signed)
Medication refill request: Alprazolam Last AEX:  02/04/18 SM Next AEX: 07/01/20 Last MMG (if hormonal medication request): n/a Refill authorized: Today, please advise

## 2020-04-14 ENCOUNTER — Other Ambulatory Visit: Payer: Self-pay | Admitting: Obstetrics & Gynecology

## 2020-04-15 NOTE — Telephone Encounter (Signed)
Medication refill request: xanax 0.5mg  Last AEX:  02-04-2018 Next AEX: 07-01-2020 Last MMG (if hormonal medication request): n/a Refill authorized: per telephone refill encounters on 11-29-2019, patient can't come in earlier for appointment due to insurance. Please approve if appropriate

## 2020-07-01 ENCOUNTER — Ambulatory Visit: Payer: Self-pay | Admitting: Obstetrics & Gynecology

## 2020-10-25 ENCOUNTER — Encounter (HOSPITAL_BASED_OUTPATIENT_CLINIC_OR_DEPARTMENT_OTHER): Payer: Self-pay | Admitting: Obstetrics & Gynecology

## 2020-10-25 ENCOUNTER — Ambulatory Visit (INDEPENDENT_AMBULATORY_CARE_PROVIDER_SITE_OTHER): Payer: Medicare Other | Admitting: Obstetrics & Gynecology

## 2020-10-25 ENCOUNTER — Other Ambulatory Visit: Payer: Self-pay

## 2020-10-25 VITALS — BP 160/80 | HR 87 | Ht 66.0 in | Wt 190.0 lb

## 2020-10-25 DIAGNOSIS — R3915 Urgency of urination: Secondary | ICD-10-CM

## 2020-10-25 DIAGNOSIS — Z9189 Other specified personal risk factors, not elsewhere classified: Secondary | ICD-10-CM

## 2020-10-25 DIAGNOSIS — Z780 Asymptomatic menopausal state: Secondary | ICD-10-CM

## 2020-10-25 DIAGNOSIS — Z90710 Acquired absence of both cervix and uterus: Secondary | ICD-10-CM | POA: Diagnosis not present

## 2020-10-25 DIAGNOSIS — B009 Herpesviral infection, unspecified: Secondary | ICD-10-CM

## 2020-10-25 DIAGNOSIS — G4709 Other insomnia: Secondary | ICD-10-CM

## 2020-10-25 DIAGNOSIS — C541 Malignant neoplasm of endometrium: Secondary | ICD-10-CM | POA: Diagnosis not present

## 2020-10-25 DIAGNOSIS — Z01419 Encounter for gynecological examination (general) (routine) without abnormal findings: Secondary | ICD-10-CM | POA: Diagnosis not present

## 2020-10-25 MED ORDER — MIRABEGRON ER 25 MG PO TB24: 25.0000 mg | ORAL_TABLET | Freq: Every day | ORAL | 1 refills | Status: DC

## 2020-10-25 MED ORDER — TRAZODONE HCL 50 MG PO TABS: 50.0000 mg | ORAL_TABLET | Freq: Every day | ORAL | 0 refills | Status: DC

## 2020-10-25 NOTE — Progress Notes (Addendum)
65 y.o. G2P2 Married White or Caucasian female here for breast and pelvic exam.  Stopped HRT on her own.  Not really having any symptoms except for urinary urgency that does seem to have worsened.  Denies hematuria.    Pt has hx of endometrial cancer but has done well since definitive surgery.  Denies vaginal bleeding.  Has some questions about her daughter who she is desirous I see.    Having some issues with sleep.  Feels needs something but wants something safe.  Options reviewed.  Will try trazodone.   Patient's last menstrual period was 07/20/1994.          Sexually active: Yes.     Health Maintenance: PCP:  Emmie Niemann.  Last wellness appt was 04/16/2020.  Lab work done at that appt.  (reviewed CBC, CMP, HbA1c) Vaccines are up to date:  Pt aware PCN vaccination should start this year Colonoscopy:  10/2017 follow up 5 years due to family hx MMG:  04/24/2020 (reveiwed in Care everywhere) BMD:  11/2016 Last pap smear:   01/2018 with neg HR HPV   H/o abnormal pap smear:  no   reports that she has never smoked. She has never used smokeless tobacco. She reports that she does not drink alcohol and does not use drugs.  Past Medical History:  Diagnosis Date  . Endometrial cancer ([place])    adenocarcinoma  . Fibrocystic breast changes   . Osteopenia     Past Surgical History:  Procedure Laterality Date  . TOTAL ABDOMINAL HYSTERECTOMY  1992   BSO  . TUBAL LIGATION      Current Outpatient Medications  Medication Sig Dispense Refill  . ALPRAZolam (XANAX) 0.5 MG tablet TAKE 1 TABLET BY MOUTH ONCE DAILY AT BEDTIME AS NEEDED FOR ANXIETY 30 tablet 0  . CALCIUM PO Take by mouth daily.    . clobetasol (TEMOVATE) 0.05 % external solution Apply topically.    Marland Kitchen Fexofenadine-Pseudoephedrine (ALLEGRA-D PO) Take by mouth as needed.    Marland Kitchen ibuprofen (ADVIL,MOTRIN) 200 MG tablet Take by mouth daily as needed.    . mirabegron ER (MYRBETRIQ) 25 MG TB24 tablet Take 1 tablet (25 mg total) by mouth daily.  One po qd 30 tablet 1  . omeprazole (PRILOSEC) 20 MG capsule Take 1 capsule (20 mg total) by mouth daily. 90 capsule 4  . Psyllium (DIETARY FIBER LAXATIVE PO) Take by mouth.    . traZODone (DESYREL) 50 MG tablet Take 1 tablet (50 mg total) by mouth at bedtime. 30 tablet 0  . triamcinolone (NASACORT) 55 MCG/ACT AERO nasal inhaler Place into the nose as needed.    . triamcinolone cream (KENALOG) 0.1 % Apply topically.    . valACYclovir (VALTREX) 500 MG tablet Take 1 tablet (500 mg total) by mouth daily. 90 tablet 0   No current facility-administered medications for this visit.    Family History  Problem Relation Age of Onset  . Diabetes Maternal Grandfather   . Colon cancer Father 21  . Colon cancer Other        paternal great uncle  . Hypertension Mother   . Other Mother 53       fall   . Breast cancer Maternal Aunt   . Other Sister 55       drug overdose    Review of Systems  Constitutional: Negative.   Respiratory: Negative.   Cardiovascular: Negative.   Gastrointestinal: Negative.   Genitourinary: Positive for urgency.  Psychiatric/Behavioral: Negative.  Exam:   BP (!) 160/80   Pulse 87   Ht 5\' 6"  (1.676 m)   Wt 190 lb (86.2 kg)   LMP 07/20/1994   SpO2 99%   BMI 30.67 kg/m   Height: 5\' 6"  (167.6 cm)  General appearance: alert, cooperative and appears stated age Breasts: normal appearance, no masses or tenderness Abdomen: soft, non-tender; bowel sounds normal; no masses,  no organomegaly Lymph nodes: Cervical, supraclavicular, and axillary nodes normal.  No abnormal inguinal nodes palpated Neurologic: Grossly normal  Pelvic: External genitalia:  no lesions              Urethra:  normal appearing urethra with no masses, tenderness or lesions              Bartholins and Skenes: normal                 Vagina: normal appearing vagina with atrophic changes and no discharge, no lesions              Cervix: absent              Pap taken: No. Bimanual Exam:   Uterus:  uterus absent              Adnexa: no mass, fullness, tenderness               Rectovaginal: Confirms               Anus:  normal sphincter tone, no lesions  Chaperone, Prince Rome, CMA, was present for exam.  Assessment/Plan: 1. Gyn exam for high risk Medicare patient - pap not indicated - mmg 04/2020 - colonoscopy 10/2017 - BMD 11/2016 - lab work done with Emmie Niemann - vaccines reviewed  2. Endometrial cancer, FIGO stage IIIB ([facility])  3. H/O abdominal hysterectomy with BSO  4. Postmenopausal - no HRT.  Stopped this on her own this past year.  5. Urinary urgency - will try myrbetriq 25mg  daily.  Rx to pharmacy for #30.  Side effects discussed.  Recheck 1 month.  Will need to monitor BP closely.   6. Other insomnia - will try trazodone 50mg  nightly.  #30/0RF.   - traZODone (DESYREL) 50 MG tablet; Take 1 tablet (50 mg total) by mouth at bedtime.  Dispense: 30 tablet; Refill: 0  7.  H/o HSV

## 2020-10-30 ENCOUNTER — Other Ambulatory Visit (HOSPITAL_BASED_OUTPATIENT_CLINIC_OR_DEPARTMENT_OTHER): Payer: Self-pay | Admitting: Obstetrics & Gynecology

## 2020-10-30 ENCOUNTER — Encounter (HOSPITAL_BASED_OUTPATIENT_CLINIC_OR_DEPARTMENT_OTHER): Payer: Self-pay

## 2020-10-30 DIAGNOSIS — Z90710 Acquired absence of both cervix and uterus: Secondary | ICD-10-CM | POA: Insufficient documentation

## 2020-10-30 DIAGNOSIS — R3915 Urgency of urination: Secondary | ICD-10-CM | POA: Insufficient documentation

## 2020-10-30 DIAGNOSIS — C541 Malignant neoplasm of endometrium: Secondary | ICD-10-CM | POA: Insufficient documentation

## 2020-10-30 DIAGNOSIS — Z780 Asymptomatic menopausal state: Secondary | ICD-10-CM | POA: Insufficient documentation

## 2020-10-30 DIAGNOSIS — G4709 Other insomnia: Secondary | ICD-10-CM | POA: Insufficient documentation

## 2020-10-30 DIAGNOSIS — B009 Herpesviral infection, unspecified: Secondary | ICD-10-CM | POA: Insufficient documentation

## 2020-10-30 MED ORDER — OXYBUTYNIN CHLORIDE ER 5 MG PO TB24: 5.0000 mg | ORAL_TABLET | Freq: Every day | ORAL | 2 refills | Status: DC

## 2020-10-30 MED ORDER — VALACYCLOVIR HCL 500 MG PO TABS: 500.0000 mg | ORAL_TABLET | Freq: Every day | ORAL | 1 refills | Status: DC

## 2020-11-27 ENCOUNTER — Other Ambulatory Visit: Payer: Self-pay

## 2020-11-27 ENCOUNTER — Encounter (HOSPITAL_BASED_OUTPATIENT_CLINIC_OR_DEPARTMENT_OTHER): Payer: Self-pay | Admitting: Obstetrics & Gynecology

## 2020-11-27 ENCOUNTER — Ambulatory Visit (INDEPENDENT_AMBULATORY_CARE_PROVIDER_SITE_OTHER): Payer: Medicare Other | Admitting: Obstetrics & Gynecology

## 2020-11-27 VITALS — BP 155/57 | HR 66 | Wt 187.0 lb

## 2020-11-27 DIAGNOSIS — R3915 Urgency of urination: Secondary | ICD-10-CM | POA: Diagnosis not present

## 2020-11-27 DIAGNOSIS — Z90710 Acquired absence of both cervix and uterus: Secondary | ICD-10-CM

## 2020-11-27 DIAGNOSIS — Z780 Asymptomatic menopausal state: Secondary | ICD-10-CM | POA: Diagnosis not present

## 2020-11-27 MED ORDER — OXYBUTYNIN CHLORIDE ER 10 MG PO TB24: 10.0000 mg | ORAL_TABLET | Freq: Every day | ORAL | 1 refills | Status: DC

## 2020-11-27 NOTE — Progress Notes (Signed)
GYNECOLOGY  VISIT  CC:   Medication recheck  HPI: 65 y.o. G2P2 Married White or Caucasian female here for discussion after starting Myrbetriq.  This was ultimately too expensive so she started oxybutynin Xl 5mg .  Pt is not sure this did anything for her.  Has not noticed any side effects. Wondering is this raising her blood pressure.  As this would be an atypical response, she needs needs to follow up with PPCP.  Would like to adjust medications.  Also, asks for Xanax.  Is taking almost every day.  Knows I don't think she should use every single day due to tolerance.  States will get rx from PCP.   Patient Active Problem List   Diagnosis Date Noted  . Endometrial cancer, FIGO stage IIIB ([place]) 10/30/2020  . H/O abdominal hysterectomy 10/30/2020  . Postmenopausal 10/30/2020  . Urinary urgency 10/30/2020  . Other insomnia 10/30/2020  . HSV (herpes simplex virus) infection 10/30/2020  . Morton's metatarsalgia 09/24/2015  . GERD (gastroesophageal reflux disease) 09/24/2015    Past Medical History:  Diagnosis Date  . Endometrial cancer ([place])    adenocarcinoma  . Fibrocystic breast changes   . Osteopenia     Past Surgical History:  Procedure Laterality Date  . TOTAL ABDOMINAL HYSTERECTOMY  1992   BSO  . TUBAL LIGATION      MEDS:   Current Outpatient Medications on File Prior to Visit  Medication Sig Dispense Refill  . ALPRAZolam (XANAX) 0.5 MG tablet TAKE 1 TABLET BY MOUTH ONCE DAILY AT BEDTIME AS NEEDED FOR ANXIETY 30 tablet 0  . CALCIUM PO Take by mouth daily.    . clobetasol (TEMOVATE) 0.05 % external solution Apply topically.    Marland Kitchen Fexofenadine-Pseudoephedrine (ALLEGRA-D PO) Take by mouth as needed.    Marland Kitchen ibuprofen (ADVIL,MOTRIN) 200 MG tablet Take by mouth daily as needed.    Marland Kitchen omeprazole (PRILOSEC) 20 MG capsule Take 1 capsule (20 mg total) by mouth daily. 90 capsule 4  . Psyllium (DIETARY FIBER LAXATIVE PO) Take by mouth.    . traZODone (DESYREL) 50 MG tablet Take 1 tablet  (50 mg total) by mouth at bedtime. 30 tablet 0  . triamcinolone (NASACORT) 55 MCG/ACT AERO nasal inhaler Place into the nose as needed.    . triamcinolone cream (KENALOG) 0.1 % Apply topically.    . valACYclovir (VALTREX) 500 MG tablet Take 1 tablet (500 mg total) by mouth daily. 30 tablet 1   No current facility-administered medications on file prior to visit.    ALLERGIES: Darvocet [propoxyphene n-acetaminophen], Penicillins, and Sulfa antibiotics  Family History  Problem Relation Age of Onset  . Diabetes Maternal Grandfather   . Colon cancer Father 60  . Colon cancer Other        paternal great uncle  . Hypertension Mother   . Other Mother 39       fall   . Breast cancer Maternal Aunt   . Other Sister 60       drug overdose    SH:  Married, non smoker  Review of Systems  Constitutional: Negative.   Genitourinary: Positive for urgency. Negative for dysuria, pelvic pain, vaginal bleeding and vaginal discharge.    PHYSICAL EXAMINATION:    BP (!) 155/57   Pulse 66   Wt 187 lb (84.8 kg)   LMP 07/20/1994   BMI 30.18 kg/m     Physical Exam Constitutional:      Appearance: Normal appearance.  Skin:    General: Skin is  warm.  Neurological:     General: No focal deficit present.     Mental Status: She is alert.  Psychiatric:        Mood and Affect: Mood normal.    Assessment/Plan: 1. Urinary urgency - will increase dosage.  Side effects, risks discussed.  Pt will monitor. - oxybutynin (DITROPAN-XL) 10 MG 24 hr tablet; Take 1 tablet (10 mg total) by mouth at bedtime.  Dispense: 30 tablet; Refill: 1  2. H/O abdominal hysterectomy due to endometrial cancer  3. Postmenopausal - no HRT

## 2020-12-25 ENCOUNTER — Encounter (HOSPITAL_BASED_OUTPATIENT_CLINIC_OR_DEPARTMENT_OTHER): Payer: Self-pay

## 2020-12-26 ENCOUNTER — Other Ambulatory Visit (HOSPITAL_BASED_OUTPATIENT_CLINIC_OR_DEPARTMENT_OTHER): Payer: Self-pay | Admitting: Obstetrics & Gynecology

## 2020-12-26 MED ORDER — SOLIFENACIN SUCCINATE 5 MG PO TABS: 5.0000 mg | ORAL_TABLET | Freq: Every day | ORAL | 1 refills | Status: DC

## 2020-12-27 ENCOUNTER — Other Ambulatory Visit (HOSPITAL_BASED_OUTPATIENT_CLINIC_OR_DEPARTMENT_OTHER): Payer: Self-pay | Admitting: Obstetrics & Gynecology

## 2021-02-18 ENCOUNTER — Encounter (HOSPITAL_BASED_OUTPATIENT_CLINIC_OR_DEPARTMENT_OTHER): Payer: Self-pay

## 2021-02-18 DIAGNOSIS — Z1379 Encounter for other screening for genetic and chromosomal anomalies: Secondary | ICD-10-CM

## 2021-02-21 ENCOUNTER — Encounter (HOSPITAL_BASED_OUTPATIENT_CLINIC_OR_DEPARTMENT_OTHER): Payer: Self-pay | Admitting: *Deleted

## 2021-03-17 ENCOUNTER — Other Ambulatory Visit (HOSPITAL_BASED_OUTPATIENT_CLINIC_OR_DEPARTMENT_OTHER): Payer: Self-pay | Admitting: Obstetrics & Gynecology

## 2021-03-17 ENCOUNTER — Telehealth: Payer: Self-pay | Admitting: Licensed Clinical Social Worker

## 2021-03-17 NOTE — Telephone Encounter (Signed)
Scheduled app per 8/26 referral. Pt aware of appt date and time.

## 2021-03-31 ENCOUNTER — Other Ambulatory Visit: Payer: Medicare Other

## 2021-03-31 ENCOUNTER — Encounter: Payer: Medicare Other | Admitting: Licensed Clinical Social Worker

## 2021-04-03 ENCOUNTER — Other Ambulatory Visit (HOSPITAL_BASED_OUTPATIENT_CLINIC_OR_DEPARTMENT_OTHER): Payer: Self-pay

## 2021-04-03 MED ORDER — SOLIFENACIN SUCCINATE 5 MG PO TABS: 5.0000 mg | ORAL_TABLET | Freq: Every day | ORAL | 2 refills | Status: AC

## 2021-04-14 ENCOUNTER — Other Ambulatory Visit: Payer: Self-pay

## 2021-04-14 ENCOUNTER — Inpatient Hospital Stay: Payer: Medicare Other

## 2021-04-14 ENCOUNTER — Encounter: Payer: Self-pay | Admitting: Licensed Clinical Social Worker

## 2021-04-14 ENCOUNTER — Inpatient Hospital Stay: Payer: Medicare Other | Attending: Genetic Counselor | Admitting: Licensed Clinical Social Worker

## 2021-04-14 DIAGNOSIS — Z800 Family history of malignant neoplasm of digestive organs: Secondary | ICD-10-CM

## 2021-04-14 DIAGNOSIS — C541 Malignant neoplasm of endometrium: Secondary | ICD-10-CM | POA: Diagnosis not present

## 2021-04-14 DIAGNOSIS — Z801 Family history of malignant neoplasm of trachea, bronchus and lung: Secondary | ICD-10-CM

## 2021-04-14 LAB — GENETIC SCREENING ORDER

## 2021-04-14 NOTE — Progress Notes (Signed)
REFERRING PROVIDER: Megan Salon, MD [address],  [place] [address]  PRIMARY PROVIDER:  Aletha Halim., PA-C  PRIMARY REASON FOR VISIT:  1. Endometrial cancer, FIGO stage IIIB ([place])   2. Family history of colon cancer   3. Family history of lung cancer   4. Family history of stomach cancer      HISTORY OF PRESENT ILLNESS:   Gina Maldonado, a 65 y.o. female, was seen for a [facility] cancer genetics consultation at the request of Dr. Sabra Heck due to a personal and family history of cancer.  Gina Maldonado presents to clinic today to discuss the possibility of a hereditary predisposition to cancer, genetic testing, and to further clarify her future cancer risks, as well as potential cancer risks for family members.   At the age of 80, Gina Maldonado was diagnosed with endometrial cancer. This was treated with TAH-BSO.   CANCER HISTORY:  Oncology History   No history exists.     RISK FACTORS:  Menarche was at age 57-14.  First live birth at age 69.  OCP use for approximately 5 years.  Ovaries intact: no.  Hysterectomy: yes.  Menopausal status: postmenopausal.  HRT use: used patch from age [age] Colonoscopy: yes;  reports polyps . Mammogram within the last year: yes. Number of breast biopsies: 0. Up to date with pelvic exams: yes. Any excessive radiation exposure in the past: no  Past Medical History:  Diagnosis Date   Endometrial cancer ([place])    adenocarcinoma   Family history of colon cancer    Family history of lung cancer    Family history of stomach cancer    Fibrocystic breast changes    Osteopenia     Past Surgical History:  Procedure Laterality Date   TOTAL ABDOMINAL HYSTERECTOMY  1992   BSO   TUBAL LIGATION      Social History   Socioeconomic History   Marital status: Married    Spouse name: Not on file   Number of children: Not on file   Years of education: Not on file   Highest education level: Not on file  Occupational  History   Not on file  Tobacco Use   Smoking status: Never   Smokeless tobacco: Never  Vaping Use   Vaping Use: Never used  Substance and Sexual Activity   Alcohol use: No   Drug use: No   Sexual activity: Yes    Partners: Male    Birth control/protection: Surgical    Comment: TAH/BSO  Other Topics Concern   Not on file  Social History Narrative   Not on file   Social Determinants of Health   Financial Resource Strain: Not on file  Food Insecurity: Not on file  Transportation Needs: Not on file  Physical Activity: Not on file  Stress: Not on file  Social Connections: Not on file     FAMILY HISTORY:  We obtained a detailed, 4-generation family history.  Significant diagnoses are listed below: Family History  Problem Relation Age of Onset   Hypertension Mother    Other Mother 63       fall    Colon cancer Father 69   Lung cancer Father    Other Sister 43   Stomach cancer Maternal Grandmother    Diabetes Maternal Grandfather    Lung cancer Paternal Grandfather    Colon cancer Other        pat. 1st cousins once removed   Breast cancer Other  mat great aunt   Gina Maldonado has 1 son (41) and 1 daughter (50) no cancers. She had 1 sister who passed at 24.   Gina Maldonado father had colon cancer at 20, this was treated with surgery. He had lung cancer twice and passed from it the second time, reportedly this was lung cancer not associated with smoking. He passed at 20. Patient had several half aunts/uncles none had cancer. No cancers for first cousins. Paternal grandfather had lung cancer. Some of his siblings' children had colon cancer, unknown ages/number of individuals. Grandmother passed of complications of a hysterectomy at 84.  Gina Maldonado mother died at 77. Patient had 1 full maternal aunt and a half uncle. Her aunt died in her 50s, no cancers. Gina Maldonado is still living, no cancers. Maternal grandmother had stomach cancer and died at 1. She had a sister who had  breast cancer and died in her 75s. No information about maternal grandfather.  Gina Maldonado is unaware of previous family history of genetic testing for hereditary cancer risks. Patient's maternal ancestors are of unknown descent, and paternal ancestors are of unknown descent. There is no reported Ashkenazi Jewish ancestry. There is no known consanguinity.   GENETIC COUNSELING ASSESSMENT: Gina Maldonado is a 65 y.o. female with a personal and family history of endometrial and colon which is somewhat suggestive of a hereditary cancer syndrome and predisposition to cancer. We, therefore, discussed and recommended the following at today's visit.   DISCUSSION: We discussed that approximately 5-10% of cancer is hereditary. Most cases of hereditary endometrial/colon cancer are associated with Lynch syndrome genes, although there are other genes associated with hereditary cancer as well. Cancers and risks are gene specific.  We discussed that testing is beneficial for several reasons including  knowing about other cancer risks, identifying potential screening and risk-reduction options that may be appropriate, and to understand if other family members could be at risk for cancer and allow them to undergo genetic testing.   We reviewed the characteristics, features and inheritance patterns of hereditary cancer syndromes. We also discussed genetic testing, including the appropriate family members to test, the process of testing, insurance coverage and turn-around-time for results. We discussed the implications of a negative, positive and/or variant of uncertain significant result. We recommended Gina Maldonado pursue genetic testing for the [facility] Multi-Cancer+RNA gene panel.   The Multi-Cancer Panel + RNA offered by [facility] includes sequencing and/or deletion duplication testing of the following 84 genes: AIP, ALK, APC, ATM, AXIN2,BAP1,  BARD1, BLM, BMPR1A, BRCA1, BRCA2, BRIP1, CASR, CDC73, CDH1, CDK4, CDKN1B,  CDKN1C, CDKN2A (p14ARF), CDKN2A (p16INK4a), CEBPA, CHEK2, CTNNA1, DICER1, DIS3L2, EGFR (c.2369C>T, p.Thr790Met variant only), EPCAM (Deletion/duplication testing only), FH, FLCN, GATA2, GPC3, GREM1 (Promoter region deletion/duplication testing only), HOXB13 (c.251G>A, p.Gly84Glu), HRAS, KIT, MAX, MEN1, MET, MITF (c.952G>A, p.Glu318Lys variant only), MLH1, MSH2, MSH3, MSH6, MUTYH, NBN, NF1, NF2, NTHL1, PALB2, PDGFRA, PHOX2B, PMS2, POLD1, POLE, POT1, PRKAR1A, PTCH1, PTEN, RAD50, RAD51C, RAD51D, RB1, RECQL4, RET, RUNX1, SDHAF2, SDHA (sequence changes only), SDHB, SDHC, SDHD, SMAD4, SMARCA4, SMARCB1, SMARCE1, STK11, SUFU, TERC, TERT, TMEM127, TP53, TSC1, TSC2, VHL, WRN and WT1.  Based on Gina Maldonado's personal and family history of cancer, she meets medical criteria for genetic testing. Despite that she meets criteria, she may still have an out of pocket cost. We discussed that if her out of pocket cost for testing is over $100, the laboratory will call and confirm whether she wants to proceed with testing.  If the out of pocket cost of testing  is less than $100 she will be billed by the genetic testing laboratory.   PLAN: After considering the risks, benefits, and limitations, Gina Maldonado provided informed consent to pursue genetic testing and the blood sample was sent to [facility] for analysis of the Multi-Cancer+RNA panel. Results should be available within approximately 2-3 weeks' time, at which point they will be disclosed by telephone to Gina Maldonado, as will any additional recommendations warranted by these results. Gina Maldonado will receive a summary of her genetic counseling visit and a copy of her results once available. This information will also be available in Epic.   Gina Maldonado questions were answered to her satisfaction today. Our contact information was provided should additional questions or concerns arise. Thank you for the referral and allowing [place] to share in the care of your  patient.   Faith Rogue, MS, [facility] Genetic Counselor [email or web address] Phone: [phone]  The patient was seen for a total of 40 minutes in face-to-face genetic counseling.  Patient was seen alone. Dr. Grayland Ormond was available for discussion regarding this case.   _______________________________________________________________________ For Office Staff:  Number of people involved in session: 1 Was an Intern/ student involved with case: no

## 2021-04-17 ENCOUNTER — Other Ambulatory Visit: Payer: Self-pay | Admitting: Family Medicine

## 2021-04-17 DIAGNOSIS — E2839 Other primary ovarian failure: Secondary | ICD-10-CM

## 2021-04-17 DIAGNOSIS — Z1231 Encounter for screening mammogram for malignant neoplasm of breast: Secondary | ICD-10-CM

## 2021-05-06 ENCOUNTER — Encounter: Payer: Self-pay | Admitting: Licensed Clinical Social Worker

## 2021-05-06 ENCOUNTER — Ambulatory Visit: Payer: Self-pay | Admitting: Licensed Clinical Social Worker

## 2021-05-06 ENCOUNTER — Telehealth: Payer: Self-pay | Admitting: Licensed Clinical Social Worker

## 2021-05-06 DIAGNOSIS — Z1379 Encounter for other screening for genetic and chromosomal anomalies: Secondary | ICD-10-CM

## 2021-05-06 DIAGNOSIS — Z800 Family history of malignant neoplasm of digestive organs: Secondary | ICD-10-CM

## 2021-05-06 DIAGNOSIS — Z801 Family history of malignant neoplasm of trachea, bronchus and lung: Secondary | ICD-10-CM

## 2021-05-06 NOTE — Telephone Encounter (Signed)
Revealed negative genetic testing.  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  We discussed that we do not know why she has had endometrial cancer or why there is cancer in the family. It could be due to a different gene that we are not testing, or something our current technology cannot pick up.  It will be important for her to keep in contact with genetics to learn if additional testing may be needed in the future.

## 2021-05-06 NOTE — Progress Notes (Signed)
HPI:  Gina Maldonado was previously seen in the [facility] due to a personal and family history of cancer and concerns regarding a hereditary predisposition to cancer. Please refer to our prior cancer genetics clinic note for more information regarding our discussion, assessment and recommendations, at the time. Gina Maldonado recent genetic test results were disclosed to her, as were recommendations warranted by these results. These results and recommendations are discussed in more detail below.  CANCER HISTORY:  Oncology History   No history exists.    FAMILY HISTORY:  We obtained a detailed, 4-generation family history.  Significant diagnoses are listed below: Family History  Problem Relation Age of Onset   Hypertension Mother    Other Mother 79       fall    Colon cancer Father 6   Lung cancer Father    Other Sister 41   Stomach cancer Maternal Grandmother    Diabetes Maternal Grandfather    Lung cancer Paternal Grandfather    Colon cancer Other        pat. 1st cousins once removed   Breast cancer Other        mat great aunt   Gina Maldonado has 1 son (66) and 1 daughter (31) no cancers. She had 1 sister who passed at 48.    Gina Maldonado father had colon cancer at 18, this was treated with surgery. He had lung cancer twice and passed from it the second time, reportedly this was lung cancer not associated with smoking. He passed at 18. Patient had several half aunts/uncles none had cancer. No cancers for first cousins. Paternal grandfather had lung cancer. Some of his siblings' children had colon cancer, unknown ages/number of individuals. Grandmother passed of complications of a hysterectomy at 75.   Gina Maldonado mother died at 83. Patient had 1 full maternal aunt and a half uncle. Her aunt died in her 20s, no cancers. Barbaraann Rondo is still living, no cancers. Maternal grandmother had stomach cancer and died at 6. She had a sister who had breast cancer and died in  her 23s. No information about maternal grandfather.   Gina Maldonado is unaware of previous family history of genetic testing for hereditary cancer risks. Patient's maternal ancestors are of unknown descent, and paternal ancestors are of unknown descent. There is no reported Ashkenazi Jewish ancestry. There is no known consanguinity.     GENETIC TEST RESULTS: Genetic testing reported out on 05/01/2021 through the [facility] Multi- cancer panel found no pathogenic mutations.   The Multi-Cancer Panel + RNA offered by [facility] includes sequencing and/or deletion duplication testing of the following 84 genes: AIP, ALK, APC, ATM, AXIN2,BAP1,  BARD1, BLM, BMPR1A, BRCA1, BRCA2, BRIP1, CASR, CDC73, CDH1, CDK4, CDKN1B, CDKN1C, CDKN2A (p14ARF), CDKN2A (p16INK4a), CEBPA, CHEK2, CTNNA1, DICER1, DIS3L2, EGFR (c.2369C>T, p.Thr790Met variant only), EPCAM (Deletion/duplication testing only), FH, FLCN, GATA2, GPC3, GREM1 (Promoter region deletion/duplication testing only), HOXB13 (c.251G>A, p.Gly84Glu), HRAS, KIT, MAX, MEN1, MET, MITF (c.952G>A, p.Glu318Lys variant only), MLH1, MSH2, MSH3, MSH6, MUTYH, NBN, NF1, NF2, NTHL1, PALB2, PDGFRA, PHOX2B, PMS2, POLD1, POLE, POT1, PRKAR1A, PTCH1, PTEN, RAD50, RAD51C, RAD51D, RB1, RECQL4, RET, RUNX1, SDHAF2, SDHA (sequence changes only), SDHB, SDHC, SDHD, SMAD4, SMARCA4, SMARCB1, SMARCE1, STK11, SUFU, TERC, TERT, TMEM127, TP53, TSC1, TSC2, VHL, WRN and WT1..   The test report has been scanned into EPIC and is located under the Molecular Pathology section of the Results Review tab.  A portion of the result report is included below for reference.  We discussed that because current genetic testing is not perfect, it is possible there may be a gene mutation in one of these genes that current testing cannot detect, but that chance is small.  There could be another gene that has not yet been discovered, or that we have not yet tested, that is responsible for the cancer diagnoses in the  family. It is also possible there is a hereditary cause for the cancer in the family that Gina Maldonado did not inherit and therefore was not identified in her testing.  Therefore, it is important to remain in touch with cancer genetics in the future so that we can continue to offer Gina Maldonado the most up to date genetic testing.   Genetic testing did identify a variant of uncertain significance (VUS) in the RECQL4 gene called c.3055+5G>A.  At this time, it is unknown if this variant is associated with increased cancer risk or if this is a normal finding, but most variants such as this get reclassified to being inconsequential. It should not be used to make medical management decisions. With time, we suspect the lab will determine the significance of this variant, if any. If we do learn more about it we will try to contact Gina Maldonado to discuss it further. However, it is important to stay in touch with [place] periodically and keep the address and phone number up to date.  ADDITIONAL GENETIC TESTING: We discussed with Gina Maldonado that her genetic testing was fairly extensive.  If there are genes identified to increase cancer risk that can be analyzed in the future, we would be happy to discuss and coordinate this testing at that time.    CANCER SCREENING RECOMMENDATIONS: Gina Maldonado test result is considered negative (normal).  This means that we have not identified a hereditary cause for her  personal and family history of cancer at this time.   While reassuring, this does not definitively rule out a hereditary predisposition to cancer. It is still possible that there could be genetic mutations that are undetectable by current technology. There could be genetic mutations in genes that have not been tested or identified to increase cancer risk.  Therefore, it is recommended she continue to follow the cancer management and screening guidelines provided by her primary healthcare provider.   An individual's  cancer risk and medical management are not determined by genetic test results alone. Overall cancer risk assessment incorporates additional factors, including personal medical history, family history, and any available genetic information that may result in a personalized plan for cancer prevention and surveillance.  RECOMMENDATIONS FOR FAMILY MEMBERS:  Relatives in this family might be at some increased risk of developing cancer, over the general population risk, simply due to the family history of cancer.  We recommended female relatives in this family have a yearly mammogram beginning at age 33, or 14 years younger than the earliest onset of cancer, an annual clinical breast exam, and perform monthly breast self-exams. Female relatives in this family should also have a gynecological exam as recommended by their primary provider.  All family members should be referred for colonoscopy starting at age 84.    It is also possible there is a hereditary cause for the cancer in Gina Maldonado's family that she did not inherit and therefore was not identified in her.  Based on Gina Maldonado's family history, we recommended paternal relatives have genetic counseling and testing. Gina Maldonado will let us know if we can be of any assistance in  coordinating genetic counseling and/or testing for these family members.  FOLLOW-UP: Lastly, we discussed with Gina Maldonado that cancer genetics is a rapidly advancing field and it is possible that new genetic tests will be appropriate for her and/or her family members in the future. We encouraged her to remain in contact with cancer genetics on an annual basis so we can update her personal and family histories and let her know of advances in cancer genetics that may benefit this family.   Our contact number was provided. Gina Maldonado questions were answered to her satisfaction, and she knows she is welcome to call us at anytime with additional questions or concerns.   Faith Rogue, MS, [facility] Genetic Counselor [email or web address] Phone: [phone]

## 2021-05-13 ENCOUNTER — Ambulatory Visit
Admission: RE | Admit: 2021-05-13 | Discharge: 2021-05-13 | Disposition: A | Discharge status: Home / Self Care | Payer: Medicare Other | Source: Ambulatory Visit | Attending: Family Medicine | Admitting: Family Medicine

## 2021-05-13 ENCOUNTER — Other Ambulatory Visit: Payer: Self-pay

## 2021-05-13 DIAGNOSIS — Z1231 Encounter for screening mammogram for malignant neoplasm of breast: Secondary | ICD-10-CM

## 2021-07-24 ENCOUNTER — Other Ambulatory Visit (HOSPITAL_BASED_OUTPATIENT_CLINIC_OR_DEPARTMENT_OTHER): Payer: Self-pay | Admitting: Obstetrics & Gynecology

## 2021-08-12 ENCOUNTER — Encounter (HOSPITAL_BASED_OUTPATIENT_CLINIC_OR_DEPARTMENT_OTHER): Payer: Self-pay | Admitting: Obstetrics & Gynecology

## 2021-08-12 ENCOUNTER — Other Ambulatory Visit: Payer: Self-pay

## 2021-08-12 ENCOUNTER — Other Ambulatory Visit (HOSPITAL_COMMUNITY)
Admission: RE | Admit: 2021-08-12 | Discharge: 2021-08-12 | Disposition: A | Discharge status: Home / Self Care | Payer: Medicare Other | Source: Ambulatory Visit | Attending: Obstetrics & Gynecology | Admitting: Obstetrics & Gynecology

## 2021-08-12 ENCOUNTER — Ambulatory Visit (HOSPITAL_BASED_OUTPATIENT_CLINIC_OR_DEPARTMENT_OTHER): Payer: Medicare Other | Admitting: Obstetrics & Gynecology

## 2021-08-12 VITALS — BP 160/62 | HR 70 | Ht 66.0 in | Wt 186.0 lb

## 2021-08-12 DIAGNOSIS — N951 Menopausal and female climacteric states: Secondary | ICD-10-CM

## 2021-08-12 DIAGNOSIS — L820 Inflamed seborrheic keratosis: Secondary | ICD-10-CM

## 2021-08-12 DIAGNOSIS — N9089 Other specified noninflammatory disorders of vulva and perineum: Secondary | ICD-10-CM | POA: Insufficient documentation

## 2021-08-12 MED ORDER — ESTRADIOL 0.05 MG/24HR TD PTWK: 0.0500 mg | MEDICATED_PATCH | TRANSDERMAL | 1 refills | Status: AC

## 2021-08-12 NOTE — Progress Notes (Signed)
GYNECOLOGY  VISIT  CC:   vulvar lesion evaluation  HPI: 66 y.o. G2P2 Married White or Caucasian female here for evaluation of vulvar lesion that seems to changed a lot recently.  Area is non tender.  There is no bleeding.  She has hx of endometrial cancer in her late 44's so she is very anxious today.  BP elevated and pt aware.  Just wants the area removed if possible.  Separately, she stopped her HRT this past year.  Just feels like she is hot all of the time and she is really tired of it.  Really wants to be back on HRT.  She was on low dosed HRT.  Risks and benefits reviewed.  Pt ready to restart.  Will resume HRT at dosage where she was.  We did discuss possibly tapering this in the future to decrease risks.  She is comfortable with this.  GYNECOLOGIC HISTORY: Patient's last menstrual period was 07/20/1994. Contraception: hysterectomy Menopausal hormone therapy: none  Patient Active Problem List   Diagnosis Date Noted   Genetic testing 05/06/2021   Family history of colon cancer 04/14/2021   Family history of lung cancer 04/14/2021   Family history of stomach cancer 04/14/2021   Endometrial cancer, FIGO stage IIIB ([place]) 10/30/2020   H/O abdominal hysterectomy 10/30/2020   Postmenopausal 10/30/2020   Urinary urgency 10/30/2020   Other insomnia 10/30/2020   HSV (herpes simplex virus) infection 10/30/2020   Morton's metatarsalgia 09/24/2015   GERD (gastroesophageal reflux disease) 09/24/2015    Past Medical History:  Diagnosis Date   Endometrial cancer ([place])    adenocarcinoma   Family history of colon cancer    Family history of lung cancer    Family history of stomach cancer    Fibrocystic breast changes    Osteopenia     Past Surgical History:  Procedure Laterality Date   TOTAL ABDOMINAL HYSTERECTOMY  1992   BSO   TUBAL LIGATION      MEDS:   Current Outpatient Medications on File Prior to Visit  Medication Sig Dispense Refill   ALPRAZolam (XANAX) 0.5 MG tablet  TAKE 1 TABLET BY MOUTH ONCE DAILY AT BEDTIME AS NEEDED FOR ANXIETY 30 tablet 0   CALCIUM PO Take by mouth daily.     clobetasol (TEMOVATE) 0.05 % external solution Apply topically.     Fexofenadine-Pseudoephedrine (ALLEGRA-D PO) Take by mouth as needed.     ibuprofen (ADVIL,MOTRIN) 200 MG tablet Take by mouth daily as needed.     omeprazole (PRILOSEC) 20 MG capsule Take 1 capsule (20 mg total) by mouth daily. 90 capsule 4   Psyllium (DIETARY FIBER LAXATIVE PO) Take by mouth.     triamcinolone (NASACORT) 55 MCG/ACT AERO nasal inhaler Place into the nose as needed.     triamcinolone cream (KENALOG) 0.1 % Apply topically.     valACYclovir (VALTREX) 500 MG tablet Take 1 tablet by mouth once daily 30 tablet 0   solifenacin (VESICARE) 5 MG tablet Take 1 tablet (5 mg total) by mouth daily. (Patient not taking: Reported on 08/12/2021) 30 tablet 2   No current facility-administered medications on file prior to visit.    ALLERGIES: Darvocet [propoxyphene n-acetaminophen], Penicillins, and Sulfa antibiotics  Family History  Problem Relation Age of Onset   Hypertension Mother    Other Mother 67       fall    Colon cancer Father 61   Lung cancer Father    Other Sister 74   Stomach cancer Maternal Grandmother  Diabetes Maternal Grandfather    Lung cancer Paternal Grandfather    Colon cancer Other        pat. 1st cousins once removed   Breast cancer Other        mat great aunt    SH:  married, non smoker  Review of Systems  Constitutional: Negative.   Genitourinary:        New vulvar lesion   PHYSICAL EXAMINATION:    BP (!) 160/62    Pulse 70    Ht 5\' 6"  (1.676 m)    Wt 186 lb (84.4 kg)    LMP 07/20/1994    BMI 30.02 kg/m     General appearance: alert, cooperative and appears stated age Lymph:  no inguinal LAD noted  Pelvic: External genitalia:  lesion on right lower vulvar just lateral to labia majora.  End of lesion is firm and dark.  Lesion is 1.3cm in length.               Urethra:  normal appearing urethra with no masses, tenderness or lesions              Bartholins and Skenes: normal                  Procedure:  Consent obtained.  Area cleansed with Betadine x 3.  1.5cc 1% lidocaine instilled.  Lesion elevated and fully excised with #11 blade.  Silver nitrate used for hemostasis.  Pt tolerated procedure well.  Chaperone, Octaviano Batty, CMA, was present for exam.  Assessment/Plan: 1. Vulvar lesion - lesion fully excised today.  Post procedure instructions given.  No sutures removed. - Surgical pathology( [place]/ [facility])  2. Seborrheic keratoses, inflamed  3. Vasomotor symptoms due to menopause - estradiol (CLIMARA - DOSED IN MG/24 HR) 0.05 mg/24hr patch; Place 1 patch (0.05 mg total) onto the skin once a week.  Dispense: 12 patch; Refill: 1

## 2021-08-14 LAB — SURGICAL PATHOLOGY

## 2021-09-25 ENCOUNTER — Ambulatory Visit
Admission: RE | Admit: 2021-09-25 | Discharge: 2021-09-25 | Disposition: A | Discharge status: Home / Self Care | Payer: Medicare Other | Source: Ambulatory Visit | Attending: Family Medicine | Admitting: Family Medicine

## 2021-09-25 ENCOUNTER — Other Ambulatory Visit: Payer: Self-pay

## 2021-09-25 DIAGNOSIS — E2839 Other primary ovarian failure: Secondary | ICD-10-CM

## 2022-02-10 IMAGING — MG MM DIGITAL SCREENING BILAT W/ TOMO AND CAD
8 series · 8 of 24 positions shown · non-contrast
Comparison: Previous exam(s).

CLINICAL DATA: Screening.

EXAM:
DIGITAL SCREENING BILATERAL MAMMOGRAM WITH TOMOSYNTHESIS AND CAD
TECHNIQUE: Bilateral screening digital craniocaudal and mediolateral oblique
mammograms were obtained. Bilateral screening digital breast
tomosynthesis was performed. The images were evaluated with
computer-aided detection.

[L MLO synth-2D]
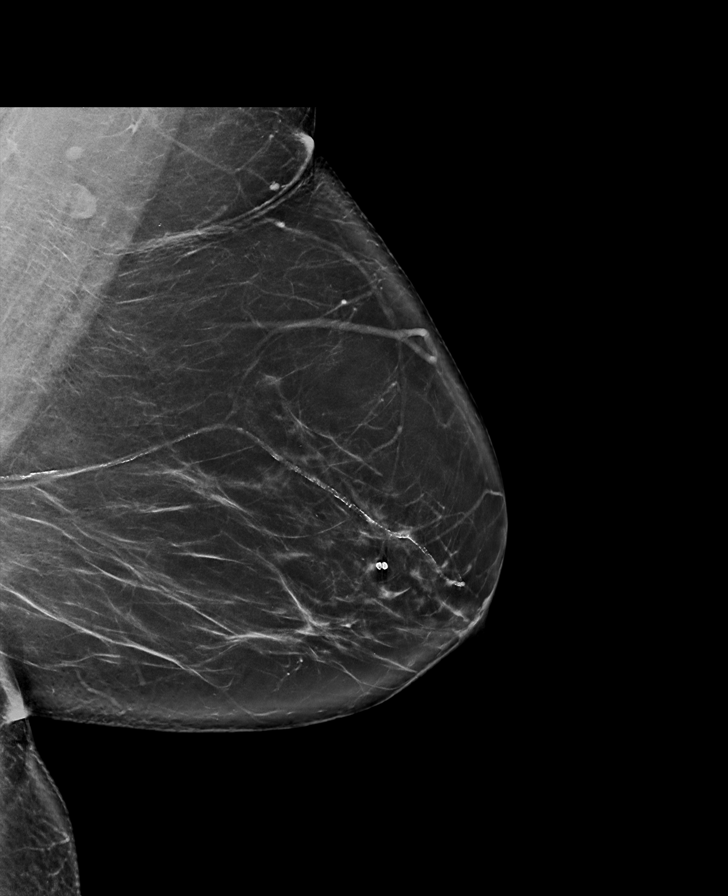

[R MLO synth-2D]
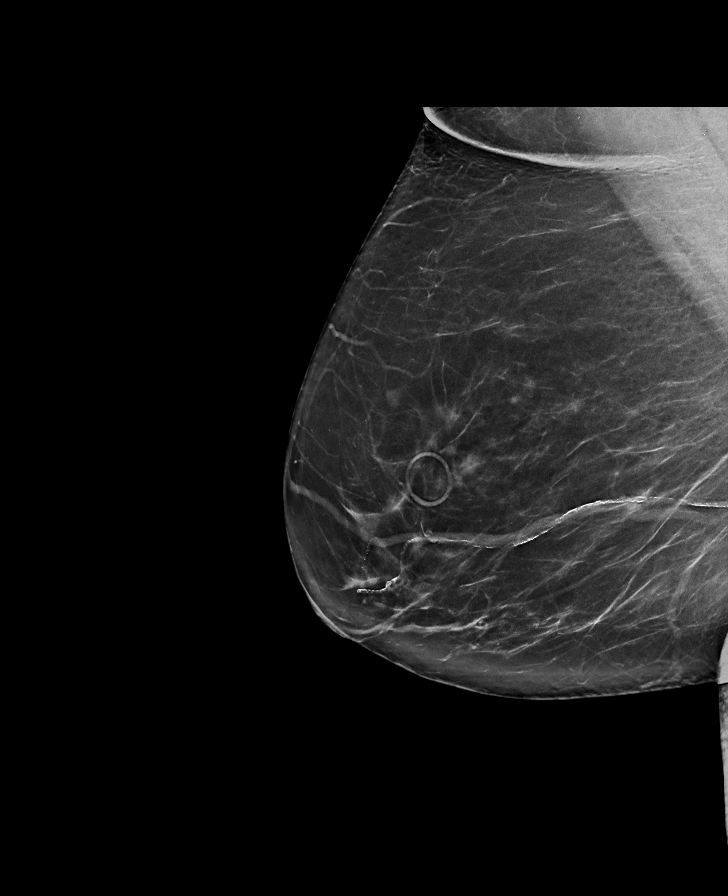

[R CC synth-2D]
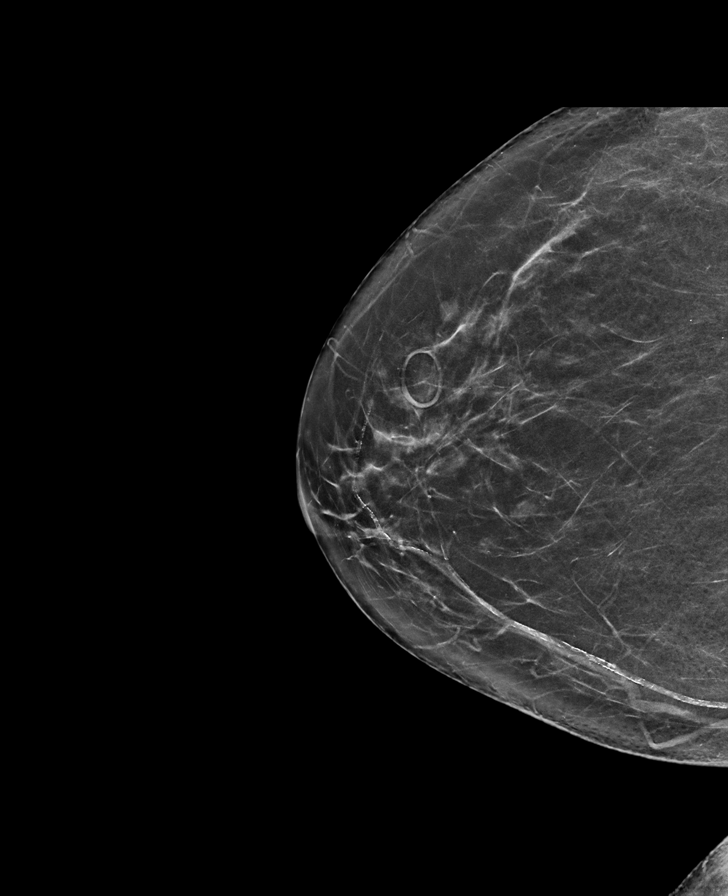

[L CC synth-2D]
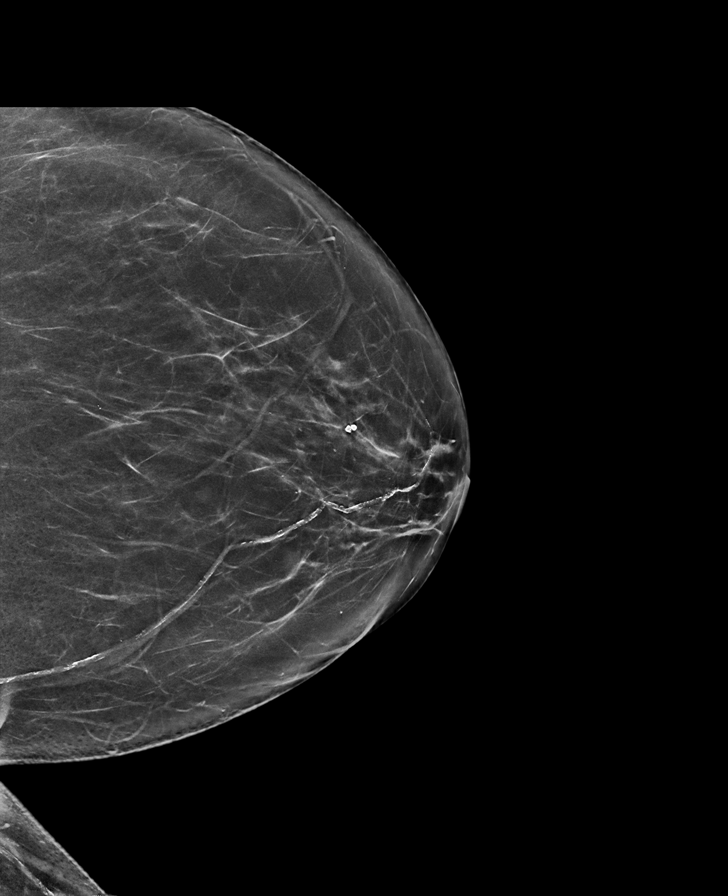

[L MLO tomo · tomo slice 45/89.0]
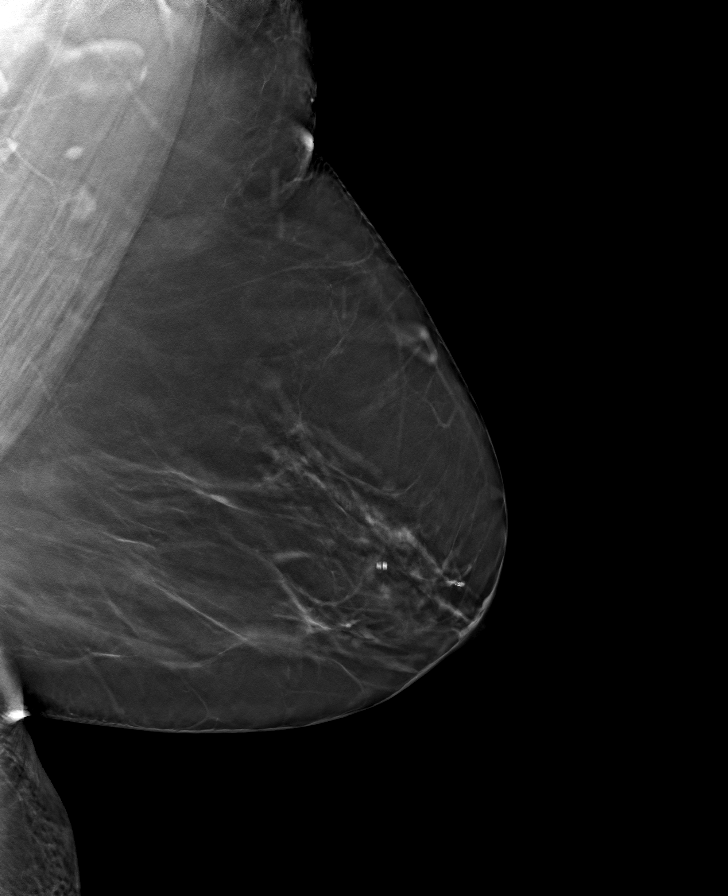

[R CC tomo · tomo slice 37/72.0]
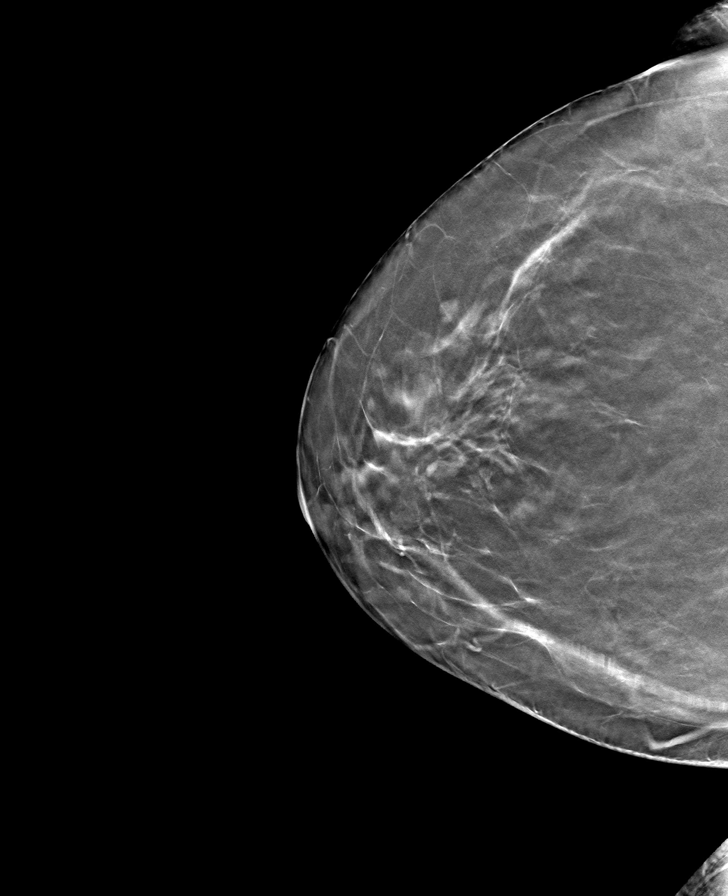

[R MLO tomo · tomo slice 41/82.0]
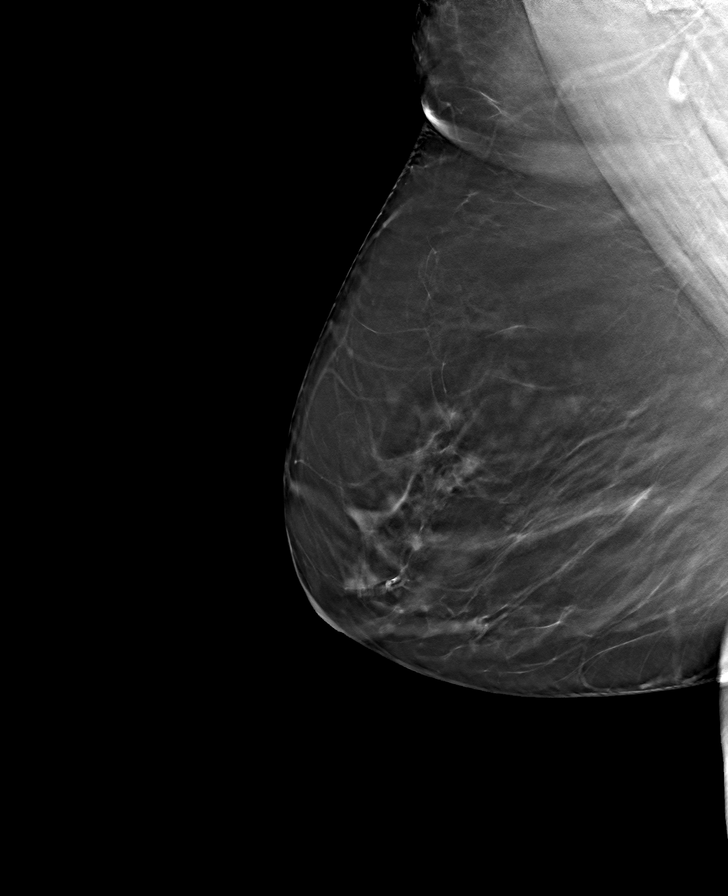

[L CC tomo · tomo slice 37/73.0]
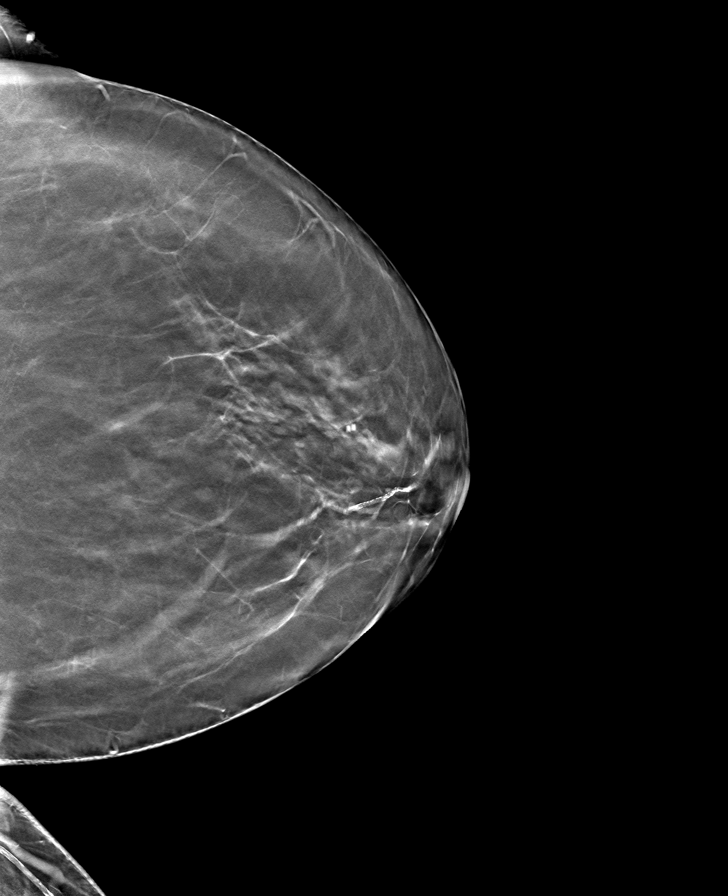

[8 of 24 positions shown; findings below may reference images not displayed]

ACR Breast Density Category b: There are scattered areas of
fibroglandular density.
FINDINGS: There are no findings suspicious for malignancy.
IMPRESSION: No mammographic evidence of malignancy. A result letter of this
screening mammogram will be mailed directly to the patient.

RECOMMENDATION:
Screening mammogram in one year. (Code:51-O-LD2)

BI-RADS CATEGORY  1: Negative.

## 2022-04-09 ENCOUNTER — Other Ambulatory Visit: Payer: Self-pay | Admitting: Family Medicine

## 2022-04-09 DIAGNOSIS — Z1231 Encounter for screening mammogram for malignant neoplasm of breast: Secondary | ICD-10-CM

## 2022-05-14 ENCOUNTER — Ambulatory Visit
Admission: RE | Admit: 2022-05-14 | Discharge: 2022-05-14 | Disposition: A | Discharge status: Home / Self Care | Payer: Medicare Other | Source: Ambulatory Visit | Attending: Family Medicine | Admitting: Family Medicine

## 2022-05-14 DIAGNOSIS — Z1231 Encounter for screening mammogram for malignant neoplasm of breast: Secondary | ICD-10-CM

## 2023-04-29 ENCOUNTER — Other Ambulatory Visit: Payer: Self-pay | Admitting: Family Medicine

## 2023-04-29 DIAGNOSIS — Z1231 Encounter for screening mammogram for malignant neoplasm of breast: Secondary | ICD-10-CM

## 2023-05-19 ENCOUNTER — Ambulatory Visit
Admission: RE | Admit: 2023-05-19 | Discharge: 2023-05-19 | Disposition: A | Discharge status: Home / Self Care | Payer: Medicare Other | Source: Ambulatory Visit | Attending: Family Medicine | Admitting: Family Medicine

## 2023-05-19 DIAGNOSIS — Z1231 Encounter for screening mammogram for malignant neoplasm of breast: Secondary | ICD-10-CM

## 2024-03-17 ENCOUNTER — Other Ambulatory Visit: Payer: Self-pay | Admitting: Family Medicine

## 2024-03-17 DIAGNOSIS — Z1231 Encounter for screening mammogram for malignant neoplasm of breast: Secondary | ICD-10-CM

## 2024-05-22 ENCOUNTER — Ambulatory Visit
Admission: RE | Admit: 2024-05-22 | Discharge: 2024-05-22 | Disposition: A | Discharge status: Home / Self Care | Source: Ambulatory Visit | Attending: Family Medicine | Admitting: Family Medicine

## 2024-05-22 DIAGNOSIS — Z1231 Encounter for screening mammogram for malignant neoplasm of breast: Secondary | ICD-10-CM
